# Patient Record
Sex: Female | Born: 1970 | Race: Asian | Hispanic: No | Marital: Married | Smoking: Never smoker
Health system: Southern US, Community
[De-identification: ages and names within clinical notes are randomized; demographics above are authoritative.]

## PROBLEM LIST (undated history)

## (undated) DIAGNOSIS — M1990 Unspecified osteoarthritis, unspecified site: Secondary | ICD-10-CM

## (undated) DIAGNOSIS — K219 Gastro-esophageal reflux disease without esophagitis: Secondary | ICD-10-CM

## (undated) DIAGNOSIS — I10 Essential (primary) hypertension: Secondary | ICD-10-CM

## (undated) DIAGNOSIS — Z87442 Personal history of urinary calculi: Secondary | ICD-10-CM

## (undated) DIAGNOSIS — F419 Anxiety disorder, unspecified: Secondary | ICD-10-CM

## (undated) DIAGNOSIS — F331 Major depressive disorder, recurrent, moderate: Secondary | ICD-10-CM

## (undated) HISTORY — DX: Unspecified osteoarthritis, unspecified site: M19.90

## (undated) HISTORY — PX: JOINT REPLACEMENT: SHX530

## (undated) HISTORY — PX: MINOR BREAST BIOPSY: SHX5977

## (undated) HISTORY — PX: BREAST EXCISIONAL BIOPSY: SUR124

## (undated) HISTORY — PX: BREAST SURGERY: SHX581

## (undated) HISTORY — DX: Anxiety disorder, unspecified: F41.9

## (undated) HISTORY — DX: Major depressive disorder, recurrent, moderate: F33.1

## (undated) HISTORY — PX: APPENDECTOMY: SHX54

## (undated) HISTORY — DX: Essential (primary) hypertension: I10

## (undated) HISTORY — DX: Gastro-esophageal reflux disease without esophagitis: K21.9

---

## 1993-06-08 HISTORY — PX: LITHOTRIPSY: SUR834

## 2005-10-01 ENCOUNTER — Ambulatory Visit: Payer: Self-pay | Admitting: Obstetrics and Gynecology

## 2012-04-20 ENCOUNTER — Ambulatory Visit: Payer: Self-pay | Admitting: Internal Medicine

## 2012-12-06 HISTORY — PX: TOTAL HIP ARTHROPLASTY: SHX124

## 2013-04-20 LAB — HM PAP SMEAR: HM Pap smear: NEGATIVE

## 2013-05-10 ENCOUNTER — Ambulatory Visit: Payer: Self-pay | Admitting: Internal Medicine

## 2014-03-13 ENCOUNTER — Ambulatory Visit: Payer: Self-pay | Admitting: Internal Medicine

## 2014-05-08 LAB — HM MAMMOGRAPHY: HM MAMMO: NORMAL

## 2014-05-22 ENCOUNTER — Ambulatory Visit: Payer: Self-pay | Admitting: Internal Medicine

## 2014-05-22 LAB — BASIC METABOLIC PANEL
BUN: 10 mg/dL (ref 4–21)
Creatinine: 0.5 mg/dL (ref <=1.1)

## 2014-05-22 LAB — LIPID PANEL
Cholesterol: 206 mg/dL — AB (ref 0–200)
HDL: 43 mg/dL (ref 35–70)
LDL Cholesterol: 135 mg/dL
Triglycerides: 143 mg/dL (ref 40–160)

## 2014-05-22 LAB — TSH: TSH: 1.7 u[IU]/mL (ref <=5.90)

## 2014-05-22 LAB — CBC AND DIFFERENTIAL: HEMOGLOBIN: 12.5 g/dL (ref 12.0–16.0)

## 2014-06-05 ENCOUNTER — Ambulatory Visit: Payer: Self-pay | Admitting: Internal Medicine

## 2015-01-18 ENCOUNTER — Ambulatory Visit (INDEPENDENT_AMBULATORY_CARE_PROVIDER_SITE_OTHER): Admitting: Family Medicine

## 2015-01-18 ENCOUNTER — Encounter: Payer: Self-pay | Admitting: Internal Medicine

## 2015-01-18 ENCOUNTER — Encounter: Payer: Self-pay | Admitting: Family Medicine

## 2015-01-18 ENCOUNTER — Other Ambulatory Visit: Payer: Self-pay

## 2015-01-18 VITALS — BP 94/72 | HR 76 | Ht 61.0 in | Wt 120.0 lb

## 2015-01-18 DIAGNOSIS — M5440 Lumbago with sciatica, unspecified side: Secondary | ICD-10-CM | POA: Insufficient documentation

## 2015-01-18 DIAGNOSIS — H8309 Labyrinthitis, unspecified ear: Secondary | ICD-10-CM | POA: Diagnosis not present

## 2015-01-18 DIAGNOSIS — M25561 Pain in right knee: Secondary | ICD-10-CM

## 2015-01-18 DIAGNOSIS — E785 Hyperlipidemia, unspecified: Secondary | ICD-10-CM | POA: Insufficient documentation

## 2015-01-18 DIAGNOSIS — N6019 Diffuse cystic mastopathy of unspecified breast: Secondary | ICD-10-CM | POA: Insufficient documentation

## 2015-01-18 DIAGNOSIS — M778 Other enthesopathies, not elsewhere classified: Secondary | ICD-10-CM | POA: Insufficient documentation

## 2015-01-18 DIAGNOSIS — M25559 Pain in unspecified hip: Secondary | ICD-10-CM | POA: Insufficient documentation

## 2015-01-18 DIAGNOSIS — IMO0002 Reserved for concepts with insufficient information to code with codable children: Secondary | ICD-10-CM | POA: Insufficient documentation

## 2015-01-18 DIAGNOSIS — K219 Gastro-esophageal reflux disease without esophagitis: Secondary | ICD-10-CM | POA: Insufficient documentation

## 2015-01-18 HISTORY — DX: Pain in right knee: M25.561

## 2015-01-18 MED ORDER — MECLIZINE HCL 25 MG PO TABS: 25.0000 mg | ORAL_TABLET | Freq: Four times a day (QID) | ORAL | Status: DC | PRN

## 2015-01-18 NOTE — Progress Notes (Signed)
Date:  01/18/2015   Name:  Renee Gomez   DOB:  [birth year]   MRN:  [id number]  PCP:  Halina Maidens, MD    Chief Complaint: Dizziness   History of Present Illness:  This is a 44 y.o. female who woke 4d ago with severe dizziness and nausea when she stood up. Sxs have improved, better during the day but worse getting out of bed. No fever, ear pain, rhinorrhea. Never had before.  Review of Systems:  Review of Systems  Patient Active Problem List   Diagnosis Date Noted  . Low back pain with sciatica 01/18/2015  . 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 Acid reflux 01/18/2015  . Arthralgia of hip 01/18/2015  . HLD (hyperlipidemia) 01/18/2015  . Gonalgia 01/18/2015  . Tendinitis of elbow or forearm 01/18/2015  . Labyrinthitis, acute 01/18/2015    Prior to Admission medications   Medication Sig Start Date End Date Taking? Authorizing Provider  ibuprofen (ADVIL) 200 MG tablet Take by mouth.   Yes Historical Provider, MD  Multiple Vitamins-Minerals (MULTIVITAMIN GUMMIES ADULT) CHEW Chew by mouth.   Yes Historical Provider, MD  meclizine (ANTIVERT) 25 MG tablet Take 1 tablet (25 mg total) by mouth every 6 (six) hours as needed for dizziness or nausea. 01/18/15   Adline Potter, MD    No Known Allergies  No past surgical history on file.  Social History  Substance Use Topics  . Smoking status: Never Smoker   . Smokeless tobacco: None  . Alcohol Use: No    Family History  Problem Relation Age of Onset  . Cancer Mother     ovarian    Medication list has been reviewed and updated.  Physical Examination: BP 94/72 mmHg  Pulse 76  Ht 5\' 1"  (1.549 m)  Wt 120 lb (54.432 kg)  BMI 22.69 kg/m2  Physical Exam  Constitutional: She appears well-developed and well-nourished. No distress.  HENT:  Head: Normocephalic and atraumatic.  Right Ear: External ear normal.  Left Ear: External ear normal.  Mouth/Throat: Oropharynx is clear and moist.  Hallpike strongly positive  Eyes:  Conjunctivae and EOM are normal. Pupils are equal, round, and reactive to light.  Neck: Neck supple.  Skin: She is not diaphoretic.    Assessment and Plan:  1. Labyrinthitis, acute, unspecified laterality Antivert prn, move slowly especially getting out of bed, expect resolution next 1-2 weeks   Return if symptoms worsen or fail to improve.  Satira Anis. [facility]  01/18/2015

## 2015-01-19 ENCOUNTER — Encounter: Payer: Self-pay | Admitting: Internal Medicine

## 2015-03-15 ENCOUNTER — Ambulatory Visit (INDEPENDENT_AMBULATORY_CARE_PROVIDER_SITE_OTHER)

## 2015-03-15 DIAGNOSIS — Z23 Encounter for immunization: Secondary | ICD-10-CM | POA: Diagnosis not present

## 2015-08-28 ENCOUNTER — Encounter: Payer: Self-pay | Admitting: Internal Medicine

## 2015-08-28 ENCOUNTER — Ambulatory Visit (INDEPENDENT_AMBULATORY_CARE_PROVIDER_SITE_OTHER): Admitting: Internal Medicine

## 2015-08-28 VITALS — BP 110/62 | HR 72 | Ht 61.0 in | Wt 120.8 lb

## 2015-08-28 DIAGNOSIS — Z0000 Encounter for general adult medical examination without abnormal findings: Secondary | ICD-10-CM

## 2015-08-28 DIAGNOSIS — Z124 Encounter for screening for malignant neoplasm of cervix: Secondary | ICD-10-CM

## 2015-08-28 DIAGNOSIS — R102 Pelvic and perineal pain: Secondary | ICD-10-CM

## 2015-08-28 DIAGNOSIS — N6019 Diffuse cystic mastopathy of unspecified breast: Secondary | ICD-10-CM

## 2015-08-28 DIAGNOSIS — M25552 Pain in left hip: Secondary | ICD-10-CM | POA: Diagnosis not present

## 2015-08-28 DIAGNOSIS — Z23 Encounter for immunization: Secondary | ICD-10-CM | POA: Diagnosis not present

## 2015-08-28 DIAGNOSIS — M25551 Pain in right hip: Secondary | ICD-10-CM

## 2015-08-28 DIAGNOSIS — K219 Gastro-esophageal reflux disease without esophagitis: Secondary | ICD-10-CM

## 2015-08-28 MED ORDER — NAPROXEN 500 MG PO TABS: 500.0000 mg | ORAL_TABLET | Freq: Two times a day (BID) | ORAL | Status: DC

## 2015-08-28 NOTE — Progress Notes (Signed)
Date:  08/28/2015   Name:  Renee Gomez   DOB:  02-May-1971   MRN:  [id number]   Chief Complaint: Annual Exam Renee Gomez is a 45 y.o. female who presents today for her Complete Annual Exam. She feels fairly well. She reports exercising none due to hip pain. She reports she is sleeping fairly well.   Fibrocystic breast - she had an abnormal mammogram last year - calcifications in the left breast. She was best to have a six-month follow-up did not complete that. She compulsive general breast discomfort but has not noticed a discrete mass.  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 Her left knee is replaced as well but she is delaying that as long as possible. She also has general stiffness and discomfort in her fingers her knees and her ankles. She is not taking any anti-inflammatory medication.  GERD - on no medication.  No recurrent symptoms.  No heartburn or trouble swallowing.  Review of Systems  Constitutional: Negative for fever, chills and fatigue.  HENT: Negative for congestion, hearing loss, tinnitus, trouble swallowing and voice change.   Eyes: Negative for visual disturbance.  Respiratory: Negative for cough, chest tightness, shortness of breath and wheezing.   Cardiovascular: Negative for chest pain, palpitations and leg swelling.  Gastrointestinal: Negative for vomiting, abdominal pain, diarrhea and constipation.  Endocrine: Negative for polydipsia and polyuria.  Genitourinary: Negative for dysuria, frequency, vaginal bleeding, vaginal discharge, genital sores, menstrual problem and pelvic pain.  Musculoskeletal: Positive for myalgias, joint swelling and arthralgias. Negative for gait problem.  Skin: Negative for color change and rash.  Neurological: Negative for dizziness, tremors, light-headedness and headaches.  Hematological: Negative for adenopathy. Does not bruise/bleed easily.  Psychiatric/Behavioral: Negative for sleep disturbance  and dysphoric mood. The patient is not nervous/anxious.     Patient Active Problem List   Diagnosis Date Noted  . Low back pain with sciatica 01/18/2015  . Fibrocystic breast 01/18/2015  . Acid reflux 01/18/2015  . Arthralgia of hip 01/18/2015  . Hyperlipidemia, mild 01/18/2015  . Knee pain, right 01/18/2015  . Tendinitis of elbow or forearm 01/18/2015    Prior to Admission medications   Not on File    No Known Allergies  Past Surgical History  Procedure Laterality Date  . 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 Appendectomy    . Minor breast biopsy Bilateral     neg x 2  . Lithotripsy  1995    Social History  Substance Use Topics  . Smoking status: Never Smoker   . Smokeless tobacco: None  . Alcohol Use: No     Medication list has been reviewed and updated.   Physical Exam  Constitutional: She is oriented to person, place, and time. She appears well-developed and well-nourished. No distress.  HENT:  Head: Normocephalic and atraumatic.  Right Ear: Tympanic membrane and ear canal normal.  Left Ear: Tympanic membrane and ear canal normal.  Nose: Right sinus exhibits no maxillary sinus tenderness. Left sinus exhibits no maxillary sinus tenderness.  Mouth/Throat: Uvula is midline and oropharynx is clear and moist.  Eyes: Conjunctivae and EOM are normal. Right eye exhibits no discharge. Left eye exhibits no discharge. No scleral icterus.  Neck: Normal range of motion. Carotid bruit is not present. No erythema present. No thyromegaly present.  Cardiovascular: Normal rate, regular rhythm, normal heart sounds and normal pulses.   Pulmonary/Chest: Effort normal. No respiratory distress. She has no wheezes. Right breast exhibits  tenderness. Right breast exhibits no mass, no nipple discharge and no skin change. Left breast exhibits tenderness. Left breast exhibits no mass, no nipple discharge and no skin change.  Abdominal: Soft. Bowel sounds are normal. There is no  hepatosplenomegaly. There is no tenderness. There is no CVA tenderness.  Genitourinary: Vagina normal and uterus normal. There is no tenderness, lesion or injury on the right labia. There is no tenderness, lesion or injury on the left labia. Cervix exhibits no motion tenderness, no discharge and no friability. Right adnexum displays no mass, no tenderness and no fullness. Left adnexum displays tenderness. Left adnexum displays no mass and no fullness.  Musculoskeletal:       Right hip: She exhibits decreased range of motion.       Left hip: She exhibits decreased range of motion.  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    She has no axillary adenopathy.       Right: No inguinal adenopathy present.       Left: No inguinal adenopathy present.  Neurological: She is alert and oriented to person, place, and time. She has normal reflexes. No cranial nerve deficit or sensory deficit.  Skin: Skin is warm, dry and intact. No rash noted.  Psychiatric: She has a normal mood and affect. Her speech is normal and behavior is normal. Thought content normal.  Nursing note and vitals reviewed.   BP 110/62 mmHg  Pulse 72  Ht 5\' 1"  (1.549 m)  Wt 120 lb 12.8 oz (54.795 kg)  BMI 22.84 kg/m2  LMP 08/13/2015 (Exact Date)  Assessment and Plan: 1. Annual physical exam - CBC with Differential/Platelet - Comprehensive metabolic panel - Lipid panel  2. Pelvic pain in female - US Pelvis Complete; Future - US Transvaginal Non-OB; Future  3. Pain of both hip joints Begin naproxen Follow up with Ortho  - naproxen (NAPROSYN) 500 MG tablet; Take 1 tablet (500 mg total) by mouth 2 (two) times daily with a meal.  Dispense: 60 tablet; Refill: 12 - TSH - Sedimentation rate  4. Fibrocystic breast, unspecified laterality Overdue for 6 mo follow up Will order bilateral Dx - MM Digital Diagnostic Bilat; Future - US BREAST LTD UNI LEFT INC AXILLA;  Future - US BREAST LTD UNI RIGHT INC AXILLA; Future  5. Gastroesophageal reflux disease without esophagitis Minimal symptoms - patient advised to take NSAID with food - CBC with Differential/Platelet  6. Need for diphtheria-tetanus-pertussis (Tdap) vaccine - Tdap vaccine greater than or equal to 7yo IM  7. Cervical cancer screening - Pap IG (Image Guided)   Halina Maidens, MD [facility]  08/28/2015

## 2015-08-28 NOTE — Patient Instructions (Signed)
Tdap Vaccine (Tetanus, Diphtheria and Pertussis): What You Need to Know 1. Why get vaccinated? Tetanus, diphtheria and pertussis are very serious diseases. Tdap vaccine can protect us from these diseases. And, Tdap vaccine given to pregnant women can protect newborn babies against pertussis. TETANUS (Lockjaw) is rare in the [place] today. It causes painful muscle tightening and stiffness, usually all over the body.  It can lead to tightening of muscles in the head and neck so you can't open your mouth, swallow, or sometimes even breathe. Tetanus kills about 1 out of 10 people who are infected even after receiving the best medical care. DIPHTHERIA is also rare in the [place] today. It can cause a thick coating to form in the back of the throat.  It can lead to breathing problems, heart failure, paralysis, and death. PERTUSSIS (Whooping Cough) causes severe coughing spells, which can cause difficulty breathing, vomiting and disturbed sleep.  It can also lead to weight loss, incontinence, and rib fractures. Up to 2 in 100 adolescents and 5 in 100 adults with pertussis are hospitalized or have complications, which could include pneumonia or death. These diseases are caused by bacteria. Diphtheria and pertussis are spread from person to person through secretions from coughing or sneezing. Tetanus enters the body through cuts, scratches, or wounds. Before vaccines, as many as 200,000 cases of diphtheria, 200,000 cases of pertussis, and hundreds of cases of tetanus, were reported in the [place] each year. Since vaccination began, reports of cases for tetanus and diphtheria have dropped by about 99% and for pertussis by about 80%. 2. Tdap vaccine Tdap vaccine can protect adolescents and adults from tetanus, diphtheria, and pertussis. One dose of Tdap is routinely given at age 11 or 12. People who did not get Tdap at that age should get it as soon as possible. Tdap is especially important  for healthcare professionals and anyone having close contact with a baby younger than 12 months. Pregnant women should get a dose of Tdap during every pregnancy, to protect the newborn from pertussis. Infants are most at risk for severe, life-threatening complications from pertussis. Another vaccine, called Td, protects against tetanus and diphtheria, but not pertussis. A Td booster should be given every 10 years. Tdap may be given as one of these boosters if you have never gotten Tdap before. Tdap may also be given after a severe cut or burn to prevent tetanus infection. Your doctor or the person giving you the vaccine can give you more information. Tdap may safely be given at the same time as other vaccines. 3. Some people should not get this vaccine  A person who has ever had a life-threatening allergic reaction after a previous dose of any diphtheria, tetanus or pertussis containing vaccine, OR has a severe allergy to any part of this vaccine, should not get Tdap vaccine. Tell the person giving the vaccine about any severe allergies.  Anyone who had coma or long repeated seizures within 7 days after a childhood dose of DTP or DTaP, or a previous dose of Tdap, should not get Tdap, unless a cause other than the vaccine was found. They can still get Td.  Talk to your doctor if you:  have seizures or another nervous system problem,  had severe pain or swelling after any vaccine containing diphtheria, tetanus or pertussis,  ever had a condition called Guillain-Barr Syndrome (GBS),  aren't feeling well on the day the shot is scheduled. 4. Risks With any medicine, including vaccines, there is   a chance of side effects. These are usually mild and go away on their own. Serious reactions are also possible but are rare. Most people who get Tdap vaccine do not have any problems with it. Mild problems following Tdap (Did not interfere with activities)  Pain where the shot was given (about 3 in 4  adolescents or 2 in 3 adults)  Redness or swelling where the shot was given (about 1 person in 5)  Mild fever of at least 100.4F (up to about 1 in 25 adolescents or 1 in 100 adults)  Headache (about 3 or 4 people in 10)  Tiredness (about 1 person in 3 or 4)  Nausea, vomiting, diarrhea, stomach ache (up to 1 in 4 adolescents or 1 in 10 adults)  Chills, sore joints (about 1 person in 10)  Body aches (about 1 person in 3 or 4)  Rash, swollen glands (uncommon) Moderate problems following Tdap (Interfered with activities, but did not require medical attention)  Pain where the shot was given (up to 1 in 5 or 6)  Redness or swelling where the shot was given (up to about 1 in 16 adolescents or 1 in 12 adults)  Fever over 102F (about 1 in 100 adolescents or 1 in 250 adults)  Headache (about 1 in 7 adolescents or 1 in 10 adults)  Nausea, vomiting, diarrhea, stomach ache (up to 1 or 3 people in 100)  Swelling of the entire arm where the shot was given (up to about 1 in 500). Severe problems following Tdap (Unable to perform usual activities; required medical attention)  Swelling, severe pain, bleeding and redness in the arm where the shot was given (rare). Problems that could happen after any vaccine:  People sometimes faint after a medical procedure, including vaccination. Sitting or lying down for about 15 minutes can help prevent fainting, and injuries caused by a fall. Tell your doctor if you feel dizzy, or have vision changes or ringing in the ears.  Some people get severe pain in the shoulder and have difficulty moving the arm where a shot was given. This happens very rarely.  Any medication can cause a severe allergic reaction. Such reactions from a vaccine are very rare, estimated at fewer than 1 in a million doses, and would happen within a few minutes to a few hours after the vaccination. As with any medicine, there is a very remote chance of a vaccine causing a serious  injury or death. The safety of vaccines is always being monitored. For more information, visit: www.cdc.gov/vaccinesafety/ 5. What if there is a serious problem? What should I look for?  Look for anything that concerns you, such as signs of a severe allergic reaction, very high fever, or unusual behavior.  Signs of a severe allergic reaction can include hives, swelling of the face and throat, difficulty breathing, a fast heartbeat, dizziness, and weakness. These would usually start a few minutes to a few hours after the vaccination. What should I do?  If you think it is a severe allergic reaction or other emergency that can't wait, call 9-1-1 or get the person to the nearest hospital. Otherwise, call your doctor.  Afterward, the reaction should be reported to the Vaccine Adverse Event Reporting System (VAERS). Your doctor might file this report, or you can do it yourself through the VAERS web site at www.vaers.hhs.gov, or by calling 1-800-822-7967. VAERS does not give medical advice.  6. The National Vaccine Injury Compensation Program The National Vaccine Injury Compensation Program (  VICP) is a federal program that was created to compensate people who may have been injured by certain vaccines. Persons who believe they may have been injured by a vaccine can learn about the program and about filing a claim by calling 1-800-338-2382 or visiting the VICP website at www.hrsa.gov/vaccinecompensation. There is a time limit to file a claim for compensation. 7. How can I learn more?  Ask your doctor. He or she can give you the vaccine package insert or suggest other sources of information.  Call your local or state health department.  Contact the Centers for Disease Control and Prevention (CDC):  Call 1-800-232-4636 (1-800-CDC-INFO) or  Visit CDC's website at www.cdc.gov/vaccines CDC Tdap Vaccine VIS (08/01/13)   This information is not intended to replace advice given to you by your health care  provider. Make sure you discuss any questions you have with your health care provider.   Document Released: 11/24/2011 Document Revised: 06/15/2014 Document Reviewed: 09/06/2013 Elsevier Interactive Patient Education 2016 Elsevier Inc.  

## 2015-08-29 ENCOUNTER — Telehealth: Payer: Self-pay

## 2015-08-29 LAB — COMPREHENSIVE METABOLIC PANEL
A/G RATIO: 1.3 (ref 1.2–2.2)
ALBUMIN: 4.2 g/dL (ref 3.5–5.5)
ALK PHOS: 54 IU/L (ref 39–117)
ALT: 13 IU/L (ref 0–32)
AST: 16 IU/L (ref 0–40)
BILIRUBIN TOTAL: 0.3 mg/dL (ref 0.0–1.2)
BUN/Creatinine Ratio: 20 (ref 9–23)
BUN: 10 mg/dL (ref 6–24)
CO2: 25 mmol/L (ref 18–29)
Calcium: 9 mg/dL (ref 8.7–10.2)
Chloride: 99 mmol/L (ref 96–106)
Creatinine, Ser: 0.51 mg/dL — ABNORMAL LOW (ref 0.57–1.00)
GFR, EST AFRICAN AMERICAN: 133 mL/min/{1.73_m2} (ref >59)
GFR, EST NON AFRICAN AMERICAN: 116 mL/min/{1.73_m2} (ref >59)
Globulin, Total: 3.2 g/dL (ref 1.5–4.5)
Glucose: 82 mg/dL (ref 65–99)
Potassium: 4.3 mmol/L (ref 3.5–5.2)
Sodium: 140 mmol/L (ref 134–144)
TOTAL PROTEIN: 7.4 g/dL (ref 6.0–8.5)

## 2015-08-29 LAB — CBC WITH DIFFERENTIAL/PLATELET
Basophils Absolute: 0.1 x10E3/uL (ref 0.0–0.2)
Basos: 1 %
EOS (ABSOLUTE): 0.1 x10E3/uL (ref 0.0–0.4)
Eos: 2 %
Hematocrit: 36.8 % (ref 34.0–46.6)
Hemoglobin: 12 g/dL (ref 11.1–15.9)
Immature Grans (Abs): 0 x10E3/uL (ref 0.0–0.1)
Immature Granulocytes: 0 %
Lymphocytes Absolute: 2.8 x10E3/uL (ref 0.7–3.1)
Lymphs: 36 %
MCH: 28.9 pg (ref 26.6–33.0)
MCHC: 32.6 g/dL (ref 31.5–35.7)
MCV: 89 fL (ref 79–97)
Monocytes Absolute: 0.5 x10E3/uL (ref 0.1–0.9)
Monocytes: 6 %
Neutrophils Absolute: 4.2 x10E3/uL (ref 1.4–7.0)
Neutrophils: 55 %
Platelets: 342 x10E3/uL (ref 150–379)
RBC: 4.15 x10E6/uL (ref 3.77–5.28)
RDW: 14.1 % (ref 12.3–15.4)
WBC: 7.7 x10E3/uL (ref 3.4–10.8)

## 2015-08-29 LAB — LIPID PANEL
Chol/HDL Ratio: 4.6 ratio — ABNORMAL HIGH (ref 0.0–4.4)
Cholesterol, Total: 231 mg/dL — ABNORMAL HIGH (ref 100–199)
HDL: 50 mg/dL
LDL Calculated: 155 mg/dL — ABNORMAL HIGH (ref 0–99)
Triglycerides: 132 mg/dL (ref 0–149)
VLDL Cholesterol Cal: 26 mg/dL (ref 5–40)

## 2015-08-29 LAB — TSH: TSH: 2.59 u[IU]/mL (ref 0.450–4.500)

## 2015-08-29 LAB — SEDIMENTATION RATE: Sed Rate: 17 mm/h (ref 0–32)

## 2015-08-29 NOTE — Telephone Encounter (Signed)
-----   Message from Glean Hess, MD sent at 08/29/2015  2:13 PM EDT ----- Labs are normal.  Cholesterol is good with high HDL. Test of inflammation is normal so not likely to be RA or Lupus as cause of arthritis.

## 2015-08-29 NOTE — Telephone Encounter (Signed)
Spoke with patient. Patient advised of all results and verbalized understanding. Will call back with any future questions or concerns. MA[facility]  

## 2015-09-03 LAB — PAP IG (IMAGE GUIDED): PAP SMEAR COMMENT: 0

## 2015-09-04 ENCOUNTER — Telehealth: Payer: Self-pay

## 2015-09-04 NOTE — Telephone Encounter (Signed)
-----   Message from Glean Hess, MD sent at 09/03/2015 12:00 PM EDT ----- Pap is normal.

## 2015-09-04 NOTE — Telephone Encounter (Signed)
Spoke with patient. Patient advised of all results and verbalized understanding. Will call back with any future questions or concerns. MA[facility]  

## 2015-09-13 ENCOUNTER — Ambulatory Visit: Payer: Self-pay

## 2015-09-13 ENCOUNTER — Other Ambulatory Visit: Payer: Self-pay

## 2015-09-23 ENCOUNTER — Ambulatory Visit
Admission: RE | Admit: 2015-09-23 | Discharge: 2015-09-23 | Disposition: A | Discharge status: Home / Self Care | Source: Ambulatory Visit | Attending: Internal Medicine | Admitting: Internal Medicine

## 2015-09-23 DIAGNOSIS — R102 Pelvic and perineal pain: Secondary | ICD-10-CM | POA: Diagnosis present

## 2015-09-24 ENCOUNTER — Telehealth: Payer: Self-pay

## 2015-09-24 NOTE — Telephone Encounter (Signed)
-----   Message from Glean Hess, MD sent at 09/23/2015 12:14 PM EDT ----- US showed prominent veins suggesting this as the cause of discomfort.  Naproxen is the treatment - take twice a day.  If symptoms are not improving, would refer to GYN.

## 2015-09-24 NOTE — Telephone Encounter (Signed)
Spoke with patient. Patient advised of all results and verbalized understanding. Will call back with any future questions or concerns. MA[facility]  

## 2015-09-27 ENCOUNTER — Ambulatory Visit
Admission: RE | Admit: 2015-09-27 | Discharge: 2015-09-27 | Disposition: A | Discharge status: Home / Self Care | Source: Ambulatory Visit | Attending: Internal Medicine | Admitting: Internal Medicine

## 2015-09-27 ENCOUNTER — Other Ambulatory Visit: Payer: Self-pay | Admitting: Internal Medicine

## 2015-09-27 DIAGNOSIS — N6019 Diffuse cystic mastopathy of unspecified breast: Secondary | ICD-10-CM | POA: Diagnosis present

## 2015-09-27 DIAGNOSIS — R921 Mammographic calcification found on diagnostic imaging of breast: Secondary | ICD-10-CM | POA: Diagnosis not present

## 2015-11-01 ENCOUNTER — Telehealth: Payer: Self-pay

## 2015-11-01 ENCOUNTER — Other Ambulatory Visit: Payer: Self-pay | Admitting: Internal Medicine

## 2015-11-01 MED ORDER — AMOXICILLIN 500 MG PO CAPS: 2000.0000 mg | ORAL_CAPSULE | Freq: Once | ORAL | Status: DC

## 2015-11-01 NOTE — Telephone Encounter (Signed)
Husband called for prophylactic Amox for dental procedure Send [facility] [address]

## 2016-02-11 ENCOUNTER — Other Ambulatory Visit: Payer: Self-pay | Admitting: Internal Medicine

## 2016-02-11 ENCOUNTER — Telehealth: Payer: Self-pay

## 2016-02-11 MED ORDER — AMOXICILLIN 500 MG PO CAPS: 2000.0000 mg | ORAL_CAPSULE | Freq: Once | ORAL | 1 refills | Status: DC

## 2016-02-11 NOTE — Telephone Encounter (Signed)
Patient needs prophylactic Abx for dental work due to hip replacement.

## 2016-02-18 ENCOUNTER — Telehealth: Payer: Self-pay

## 2016-02-18 ENCOUNTER — Other Ambulatory Visit: Payer: Self-pay | Admitting: Internal Medicine

## 2016-02-18 MED ORDER — AMOXICILLIN 500 MG PO CAPS: 2000.0000 mg | ORAL_CAPSULE | Freq: Once | ORAL | 1 refills | Status: DC

## 2016-02-18 NOTE — Telephone Encounter (Signed)
Patient's husband called and stated she needs amoxicillin for her dental appt for next week - stated his wife received a script last week but it's a two part cleaning and needs another refill.  Pharmacy: [facility] near [facility] in [place]  If there are any questions please contact husband back and leave voicemail if unable to reach.

## 2016-02-18 NOTE — Telephone Encounter (Signed)
Please review this

## 2016-02-18 NOTE — Telephone Encounter (Signed)
I sent #4 capsules with 1 refill on 02/11/16 so there should be a refill available.

## 2016-05-12 ENCOUNTER — Emergency Department
Admission: EM | Admit: 2016-05-12 | Discharge: 2016-05-12 | Disposition: A | Discharge status: Home / Self Care | Attending: Emergency Medicine | Admitting: Emergency Medicine

## 2016-05-12 ENCOUNTER — Encounter: Payer: Self-pay | Admitting: Emergency Medicine

## 2016-05-12 ENCOUNTER — Emergency Department

## 2016-05-12 DIAGNOSIS — R1030 Lower abdominal pain, unspecified: Secondary | ICD-10-CM | POA: Diagnosis present

## 2016-05-12 DIAGNOSIS — R55 Syncope and collapse: Secondary | ICD-10-CM | POA: Insufficient documentation

## 2016-05-12 DIAGNOSIS — M87052 Idiopathic aseptic necrosis of left femur: Secondary | ICD-10-CM | POA: Diagnosis not present

## 2016-05-12 DIAGNOSIS — N3000 Acute cystitis without hematuria: Secondary | ICD-10-CM | POA: Diagnosis not present

## 2016-05-12 LAB — URINALYSIS, COMPLETE (UACMP) WITH MICROSCOPIC
Bilirubin Urine: NEGATIVE
Glucose, UA: NEGATIVE mg/dL
Hgb urine dipstick: NEGATIVE
Ketones, ur: NEGATIVE mg/dL
Nitrite: NEGATIVE
PH: 8.5 — AB (ref 5.0–8.0)
Protein, ur: NEGATIVE mg/dL
SPECIFIC GRAVITY, URINE: 1.015 (ref 1.005–1.030)

## 2016-05-12 LAB — BASIC METABOLIC PANEL
Anion gap: 7 (ref 5–15)
BUN: 11 mg/dL (ref 6–20)
CALCIUM: 8.8 mg/dL — AB (ref 8.9–10.3)
CHLORIDE: 106 mmol/L (ref 101–111)
CO2: 25 mmol/L (ref 22–32)
CREATININE: 0.52 mg/dL (ref 0.44–1.00)
GFR calc Af Amer: >60 mL/min (ref >60)
GFR calc non Af Amer: >60 mL/min (ref >60)
Glucose, Bld: 93 mg/dL (ref 65–99)
Potassium: 4 mmol/L (ref 3.5–5.1)
SODIUM: 138 mmol/L (ref 135–145)

## 2016-05-12 LAB — CBC
HCT: 34.5 % — ABNORMAL LOW (ref 35.0–47.0)
Hemoglobin: 11.3 g/dL — ABNORMAL LOW (ref 12.0–16.0)
MCH: 28 pg (ref 26.0–34.0)
MCHC: 32.9 g/dL (ref 32.0–36.0)
MCV: 85.3 fL (ref 80.0–100.0)
PLATELETS: 247 10*3/uL (ref 150–440)
RBC: 4.04 MIL/uL (ref 3.80–5.20)
RDW: 14.6 % — AB (ref 11.5–14.5)
WBC: 6.7 10*3/uL (ref 3.6–11.0)

## 2016-05-12 MED ORDER — DEXTROSE 5 % IV SOLN: 1.0000 g | Freq: Once | INTRAVENOUS | Status: DC

## 2016-05-12 MED ORDER — SODIUM CHLORIDE 0.9 % IV BOLUS (SEPSIS)
1000.0000 mL | Freq: Once | INTRAVENOUS | Status: AC
  Administered 2016-05-12: 1000 mL via INTRAVENOUS

## 2016-05-12 MED ORDER — CEFTRIAXONE SODIUM-DEXTROSE 1-3.74 GM-% IV SOLR
1.0000 g | Freq: Once | INTRAVENOUS | Status: AC
  Administered 2016-05-12: 1 g via INTRAVENOUS
  Filled 2016-05-12: qty 50

## 2016-05-12 MED ORDER — IOPAMIDOL (ISOVUE-300) INJECTION 61%
100.0000 mL | Freq: Once | INTRAVENOUS | Status: AC | PRN
  Administered 2016-05-12: 100 mL via INTRAVENOUS

## 2016-05-12 MED ORDER — CEPHALEXIN 500 MG PO CAPS: 500.0000 mg | ORAL_CAPSULE | Freq: Two times a day (BID) | ORAL | 0 refills | Status: DC

## 2016-05-12 MED ORDER — IOPAMIDOL (ISOVUE-300) INJECTION 61%
30.0000 mL | Freq: Once | INTRAVENOUS | Status: AC | PRN
  Administered 2016-05-12: 30 mL via ORAL

## 2016-05-12 NOTE — ED Triage Notes (Signed)
Patient presents to the ED with lower abdominal pain and dizziness x 2 days.  Patient reports a pre-syncopal episode this morning.  Patient appears pale and tired.  Patient is in no obvious distress at this time.  Patient reports nausea but denies vomiting and diarrhea.

## 2016-05-12 NOTE — ED Notes (Signed)
Patient transported to CT 

## 2016-05-12 NOTE — Discharge Instructions (Addendum)
You were evaluated for abdominal pain, and frequency of urination, and are being treated for urinary tract infection with antibiotic Keflex. Return to the emergency room for any worsening symptoms including worsening abdominal pain, dizziness or passing out, black or bloody stool, vomiting, concern for dehydration such as not making urine or dry mouth, chest pain, irregular heartbeat, numbness, weakness, or any other symptoms concerning to you.  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

## 2016-05-12 NOTE — ED Notes (Signed)
ED Provider at bedside. 

## 2016-05-12 NOTE — ED Notes (Addendum)
Pt in via triage with complaints of lower abdominal cramping since this morning, pt reports feeling nauseas, short of breath and flushed with a near syncope episode.  Pt denies any abnormal vaginal bleeding, discharge or urinary symptoms.  Pt A/Ox4, vitals WDL, no immediate distress noted at this time.

## 2016-05-12 NOTE — ED Provider Notes (Signed)
[facility] Emergency Department Provider Note ____________________________________________   I have reviewed the triage vital signs and the triage nursing note.  HISTORY  Chief Complaint Abdominal Pain and Near Syncope   Historian Patient  HPI Renee Gomez is a 45 y.o. female here for 24 hours of lower abdominal pain.  Started yesterday, points to pelvis and both sides, although left may have been worse.  History of appendicitis.  Denies fever. Reports frequency of urination and pain somewhat worse with urination. Nausea without vomiting. No diarrhea. She did have a near syncopal episode yesterday where she was going to the bathroom and felt a severe lower abdominal pain and buttock on the cold sweat and felt lightheaded and dizzy. She feels a little bit better from that now. No chest pain or palpitations. No coughing. No recent upper respiratory symptoms. Symptoms are moderate, but improved from what they were previously.    History reviewed. No pertinent past medical history.  Patient Active Problem List   Diagnosis Date Noted  . Low back pain with sciatica 01/18/2015  . Fibrocystic breast 01/18/2015  . Acid reflux 01/18/2015  . Arthralgia of hip 01/18/2015  . Hyperlipidemia, mild 01/18/2015  . Knee pain, right 01/18/2015  . Tendinitis of elbow or forearm 01/18/2015    Past Surgical History:  Procedure Laterality Date  . APPENDECTOMY    . BREAST EXCISIONAL BIOPSY Bilateral 25+yrs ago   neg  . LITHOTRIPSY  1995  . MINOR BREAST BIOPSY Bilateral    neg x 2  . TOTAL HIP ARTHROPLASTY Right 12/2012    Prior to Admission medications   Medication Sig Start Date End Date Taking? Authorizing Provider  cephALEXin (KEFLEX) 500 MG capsule Take 1 capsule (500 mg total) by mouth 2 (two) times daily. 05/12/16   Lisa Roca, MD  naproxen (NAPROSYN) 500 MG tablet Take 1 tablet (500 mg total) by mouth 2 (two) times daily with a meal. Patient not taking:  Reported on 05/12/2016 08/28/15   Glean Hess, MD    No Known Allergies  Family History  Problem Relation Age of Onset  . Cancer Mother     ovarian    Social History Social History  Substance Use Topics  . Smoking status: Never Smoker  . Smokeless tobacco: Never Used  . Alcohol use No    Review of Systems  Constitutional: Negative for fever. Eyes: Negative for visual changes. ENT: Negative for sore throat. Cardiovascular: Negative for chest pain. Respiratory: Negative for shortness of breath. Gastrointestinal: As per history of present illness. Genitourinary: Positive for frequency of urination yesterday.. Musculoskeletal: Negative for back pain. Skin: Negative for rash. Neurological: Negative for headache. 10 point Review of Systems otherwise negative ____________________________________________   PHYSICAL EXAM:  VITAL SIGNS: ED Triage Vitals  Enc Vitals Group     BP 05/12/16 0850 (!) 106/51     Pulse Rate 05/12/16 0850 66     Resp 05/12/16 0850 18     Temp 05/12/16 0850 97.6 F (36.4 C)     Temp Source 05/12/16 0850 Oral     SpO2 05/12/16 0850 100 %     Weight 05/12/16 0851 125 lb (56.7 kg)     Height 05/12/16 0851 5\' 1"  (1.549 m)     Head Circumference --      Peak Flow --      Pain Score 05/12/16 0851 5     Pain Loc --      Pain Edu? --  Excl. in [place]? --      Constitutional: Alert and oriented. Well appearing and in no distress. HEENT   Head: Normocephalic and atraumatic.      Eyes: Conjunctivae are normal. PERRL. Normal extraocular movements.      Ears:         Nose: No congestion/rhinnorhea.   Mouth/Throat: Mucous membranes are moist.   Neck: No stridor. Cardiovascular/Chest: Normal rate, regular rhythm.  No murmurs, rubs, or gallops. Respiratory: Normal respiratory effort without tachypnea nor retractions. Breath sounds are clear and equal bilaterally. No wheezes/rales/rhonchi. Gastrointestinal: Soft. No distention, no  guarding, no rebound. Moderate tenderness in the suprapubic and left lower quadrant.  Genitourinary/rectal: Deferred Musculoskeletal: Nontender with normal range of motion in all extremities. No joint effusions.  No lower extremity tenderness.  No edema. Neurologic:  Normal speech and language. No gross or focal neurologic deficits are appreciated. Skin:  Skin is warm, dry and intact. No rash noted. Psychiatric: Mood and affect are normal. Speech and behavior are normal. Patient exhibits appropriate insight and judgment.   ____________________________________________  LABS (pertinent positives/negatives)  Labs Reviewed  BASIC METABOLIC PANEL - Abnormal; Notable for the following:       Result Value   Calcium 8.8 (*)    All other components within normal limits  CBC - Abnormal; Notable for the following:    Hemoglobin 11.3 (*)    HCT 34.5 (*)    RDW 14.6 (*)    All other components within normal limits  URINALYSIS, COMPLETE (UACMP) WITH MICROSCOPIC - Abnormal; Notable for the following:    APPearance CLOUDY (*)    pH 8.5 (*)    Leukocytes, UA SMALL (*)    Squamous Epithelial / LPF 6-30 (*)    Bacteria, UA FEW (*)    All other components within normal limits  URINE CULTURE  CULTURE, BLOOD (ROUTINE X 2)  CULTURE, BLOOD (ROUTINE X 2)    ____________________________________________    EKG I, Lisa Roca, MD, the attending physician have personally viewed and interpreted all ECGs.  62 bpm. Normal sinus rhythm. Normal axis. Narrow QRS. Nonspecific ST and T-wave ____________________________________________  RADIOLOGY All Xrays were viewed by me. Imaging interpreted by Radiologist.  CT abdomen and pelvis with contrast:  IMPRESSION: 1. No acute abdominal or pelvic pathology. 2. Avascular necrosis of the left hip.  __________________________________________  PROCEDURES  Procedure(s) performed: None  Critical Care performed:  None  ____________________________________________   ED COURSE / ASSESSMENT AND PLAN  Pertinent labs & imaging results that were available during my care of the patient were reviewed by me and considered in my medical decision making (see chart for details).   Renee Gomez is here with lower abdominal pain. Her urinalysis may be consistent with urinary tract infection especially given her symptoms of frequency of urination yesterday. However she is more tender especially in the left lower quadrant, and I discussed risks and benefit for obtaining CT scan for further evaluation and we chose to proceed.  No reported fevers, and afebrile here. No elevated pulse rate. Her blood pressures are around 123456 systolic with one blood pressure document and 93/44. She is given some IV fluid bolus here in the emergency department.  After urine culture, patient will be given Rocephin. Although I don't think that she is showing any signs of sepsis now, given the issue with the near syncopal feeling yesterday, this sounds like was probably vagal associated pain, but I will go ahead and send blood cultures as well.  No  neurologic symptoms, I'm less suspicious for acute intravascular emergency.  CT skin abdomen reassuring. Patient feels well and she will be discharged home with treatment for UTI.  She knows of the chronic condition of the left hip, and is asked to follow-up with her orthopedic doctor.    CONSULTATIONS:  None  Patient / Family / Caregiver informed of clinical course, medical decision-making process, and agree with plan.   I discussed return precautions, follow-up instructions, and discharge instructions with patient and/or family.   ___________________________________________   FINAL CLINICAL IMPRESSION(S) / ED DIAGNOSES   Final diagnoses:  Acute cystitis without hematuria  Near syncope  Avascular necrosis of bone of left hip [facility])              Note: This dictation  was prepared with Dragon dictation. Any transcriptional errors that result from this process are unintentional    Lisa Roca, MD 05/12/16 1536

## 2016-05-13 LAB — URINE CULTURE

## 2016-05-17 LAB — CULTURE, BLOOD (ROUTINE X 2)
Culture: NO GROWTH
Culture: NO GROWTH

## 2016-07-22 ENCOUNTER — Ambulatory Visit (INDEPENDENT_AMBULATORY_CARE_PROVIDER_SITE_OTHER): Admitting: Internal Medicine

## 2016-07-22 ENCOUNTER — Encounter: Payer: Self-pay | Admitting: Internal Medicine

## 2016-07-22 VITALS — BP 128/80 | HR 92 | Temp 98.5°F | Ht 61.0 in | Wt 125.0 lb

## 2016-07-22 DIAGNOSIS — R69 Illness, unspecified: Secondary | ICD-10-CM

## 2016-07-22 DIAGNOSIS — J0100 Acute maxillary sinusitis, unspecified: Secondary | ICD-10-CM

## 2016-07-22 DIAGNOSIS — J111 Influenza due to unidentified influenza virus with other respiratory manifestations: Secondary | ICD-10-CM

## 2016-07-22 MED ORDER — OSELTAMIVIR PHOSPHATE 75 MG PO CAPS: 75.0000 mg | ORAL_CAPSULE | Freq: Two times a day (BID) | ORAL | 0 refills | Status: DC

## 2016-07-22 MED ORDER — AZITHROMYCIN 250 MG PO TABS: ORAL_TABLET | ORAL | 0 refills | Status: DC

## 2016-07-22 NOTE — Progress Notes (Signed)
Date:  07/22/2016   Name:  Renee Gomez   DOB:  Oct 12, 1970   MRN:  [id number]   Chief Complaint: URI and Headache (Pt stated headache, body ache for 2 days) URI   This is a new problem. The current episode started yesterday. The problem has been gradually worsening. Maximum temperature: body aches and sweats - has not taken temperature. Associated symptoms include congestion, coughing, headaches, sinus pain and a sore throat. Pertinent negatives include no abdominal pain, chest pain, diarrhea, rash, vomiting or wheezing. She has tried acetaminophen for the symptoms. The treatment provided mild relief.  Husband was seen this AM with influenza sx.    Review of Systems  Constitutional: Positive for chills, diaphoresis and fatigue.  HENT: Positive for congestion, sinus pain and sore throat.   Respiratory: Positive for cough. Negative for chest tightness and wheezing.   Cardiovascular: Negative for chest pain.  Gastrointestinal: Negative for abdominal pain, diarrhea and vomiting.  Musculoskeletal: Positive for myalgias.  Skin: Negative for rash.  Neurological: Positive for headaches. Negative for dizziness and numbness.    Patient Active Problem List   Diagnosis Date Noted  . Low back pain with sciatica 01/18/2015  . Fibrocystic breast 01/18/2015  . Acid reflux 01/18/2015  . Arthralgia of hip 01/18/2015  . Hyperlipidemia, mild 01/18/2015  . Knee pain, right 01/18/2015  . Tendinitis of elbow or forearm 01/18/2015    Prior to Admission medications   Medication Sig Start Date End Date Taking? Authorizing Provider  naproxen (NAPROSYN) 500 MG tablet Take 1 tablet (500 mg total) by mouth 2 (two) times daily with a meal. Patient not taking: Reported on 05/12/2016 08/28/15   Glean Hess, MD    No Known Allergies  Past Surgical History:  Procedure Laterality Date  . APPENDECTOMY    . BREAST EXCISIONAL BIOPSY Bilateral 25+yrs ago   neg  . LITHOTRIPSY  1995  . MINOR BREAST  BIOPSY Bilateral    neg x 2  . TOTAL HIP ARTHROPLASTY Right 12/2012    Social History  Substance Use Topics  . Smoking status: Never Smoker  . Smokeless tobacco: Never Used  . Alcohol use No     Medication list has been reviewed and updated.   Physical Exam  Constitutional: She is oriented to person, place, and time. She appears well-developed. She has a sickly appearance. No distress.  HENT:  Head: Normocephalic and atraumatic.  Right Ear: Tympanic membrane and ear canal normal.  Left Ear: Tympanic membrane and ear canal normal.  Nose: Right sinus exhibits maxillary sinus tenderness. Left sinus exhibits maxillary sinus tenderness.  Mouth/Throat: No posterior oropharyngeal erythema.  Cardiovascular: Normal rate, regular rhythm and normal heart sounds.   Pulmonary/Chest: Effort normal and breath sounds normal. No respiratory distress.  Musculoskeletal: Normal range of motion.  Lymphadenopathy:    She has no cervical adenopathy.  Neurological: She is alert and oriented to person, place, and time.  Skin: Skin is warm and dry. No rash noted.  Psychiatric: She has a normal mood and affect. Her behavior is normal. Thought content normal.  Nursing note and vitals reviewed.   BP 128/80   Pulse 92   Temp 98.5 F (36.9 C)   Ht 5\' 1"  (1.549 m)   Wt 125 lb (56.7 kg)   SpO2 98%   BMI 23.62 kg/m   Assessment and Plan: 1. Influenza-like illness Continue Advil every 6 hours, fluids and rest - oseltamivir (TAMIFLU) 75 MG capsule; Take 1 capsule (75 mg  total) by mouth 2 (two) times daily.  Dispense: 10 capsule; Refill: 0  2. Acute non-recurrent maxillary sinusitis - azithromycin (ZITHROMAX Z-PAK) 250 MG tablet; UAD  Dispense: 6 each; Refill: 0   Halina Maidens, MD [facility]  07/22/2016

## 2016-07-22 NOTE — Patient Instructions (Signed)

## 2016-08-15 IMAGING — MG MM DIGITAL DIAGNOSTIC BILAT W/ TOMO W/ CAD
9 of 16 series · 9 of 32 positions shown · non-contrast
Comparison: Previous exam(s).

CLINICAL DATA: Patient was recommended for a six-month follow-up of
probably benign calcifications in the left breast after a diagnostic
mammogram dated 06/05/2014. Patient returns today for that follow-up
exam.

EXAM:
2D DIGITAL DIAGNOSTIC BILATERAL MAMMOGRAM WITH CAD AND ADJUNCT TOMO

[R CC (1 of 2)]
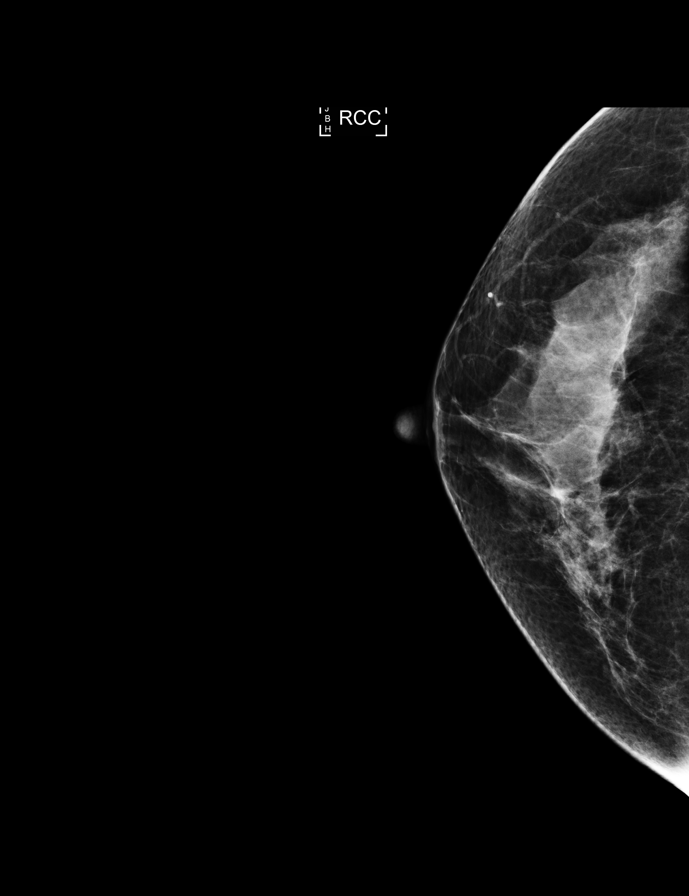

[L CC]
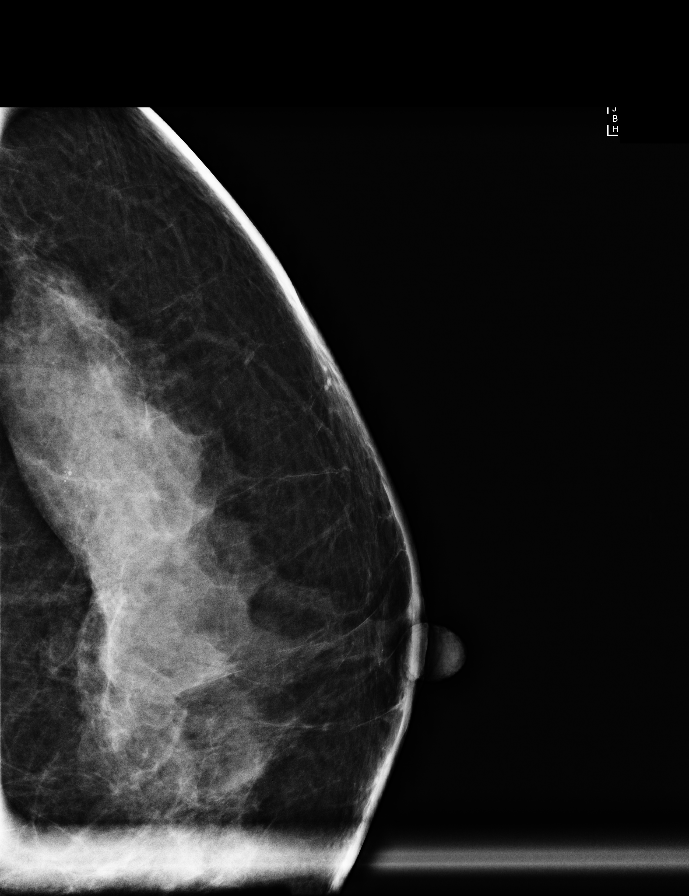

[L ML]
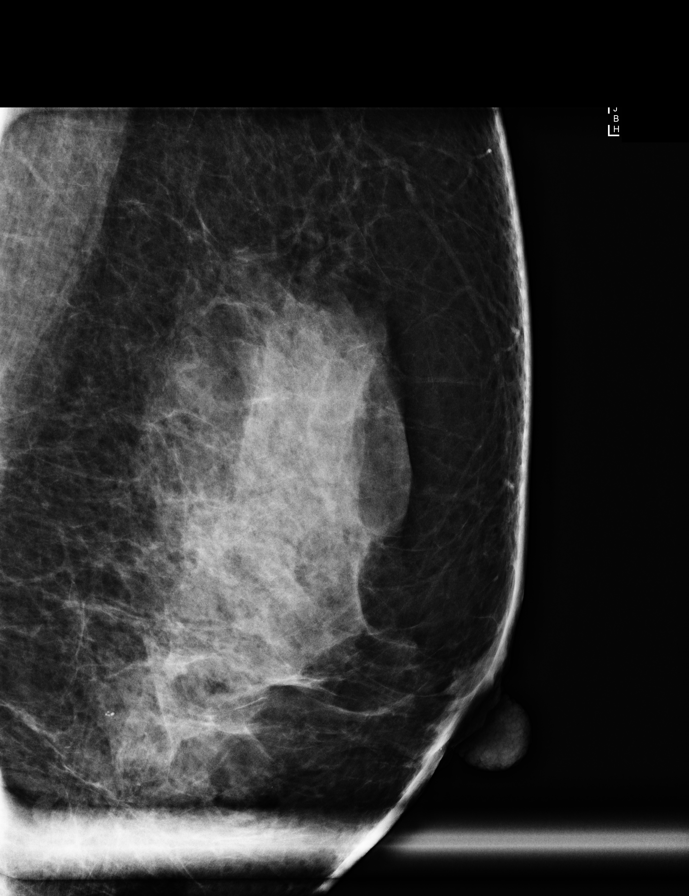

[R MLO (1 of 2)]
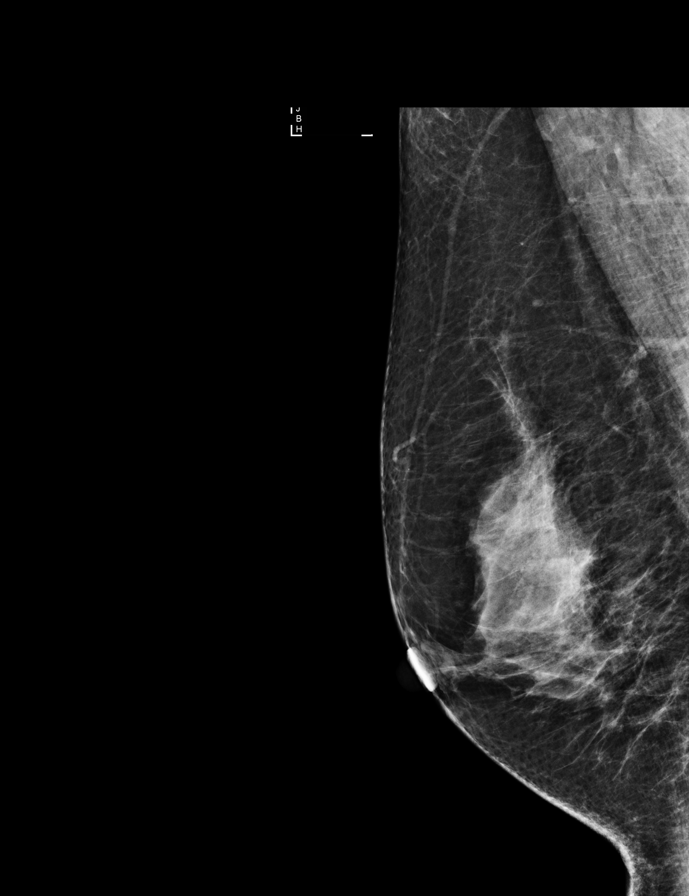

[R CC synth-2D]
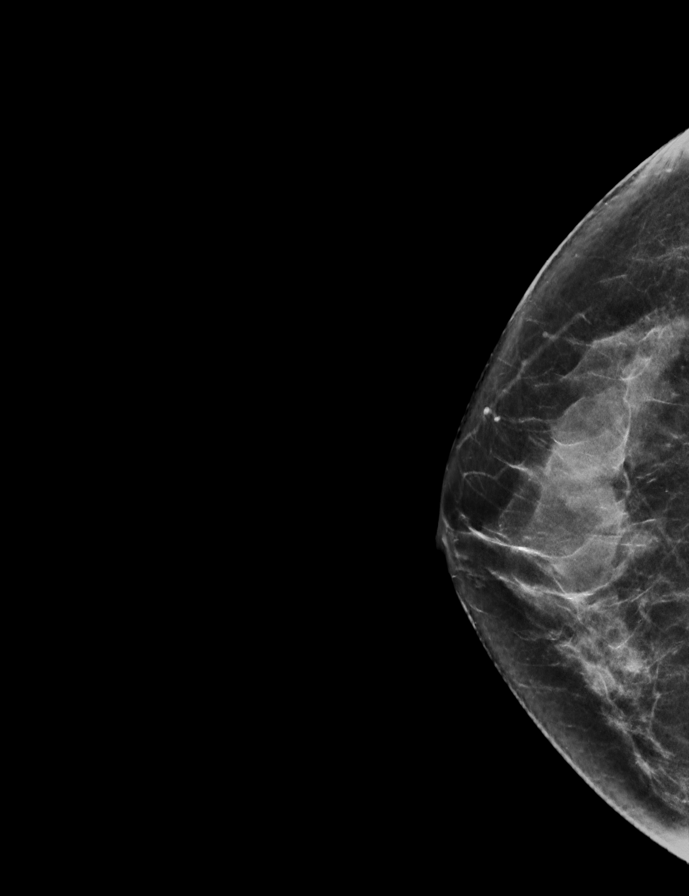

[R MLO (2 of 2)]
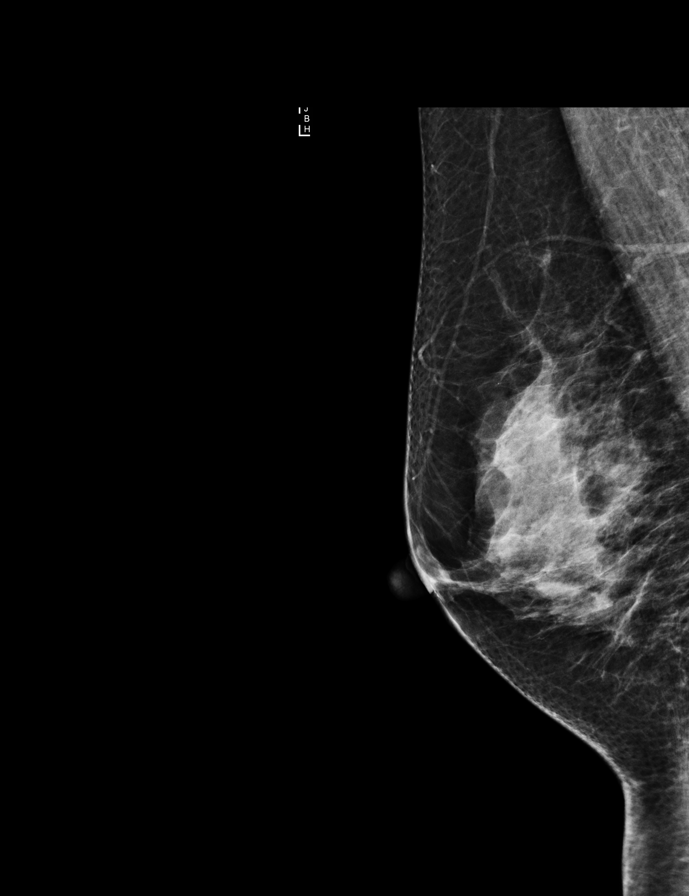

[L MLO]
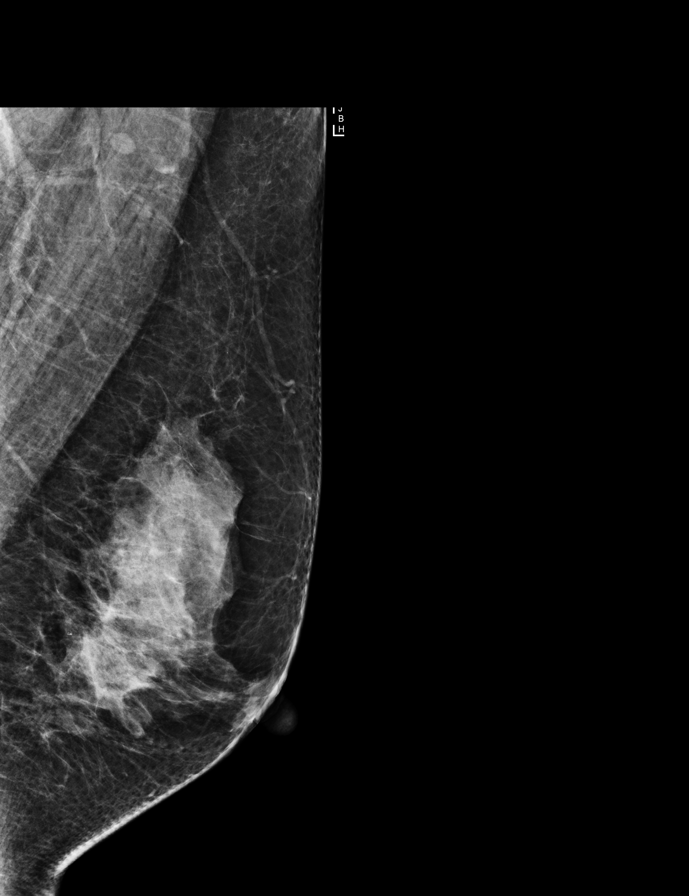

[L CC synth-2D]
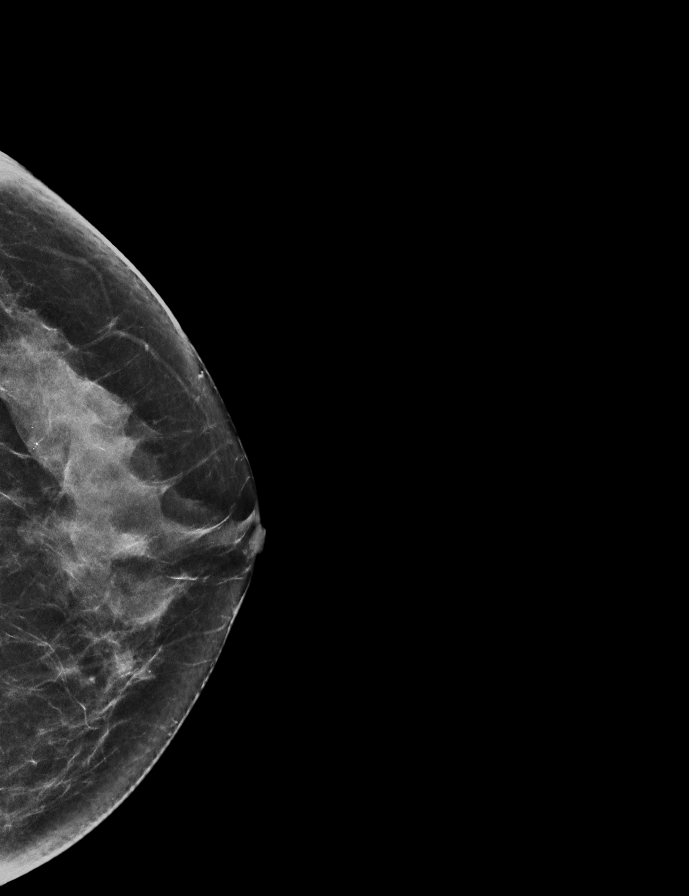

[R CC (2 of 2)]
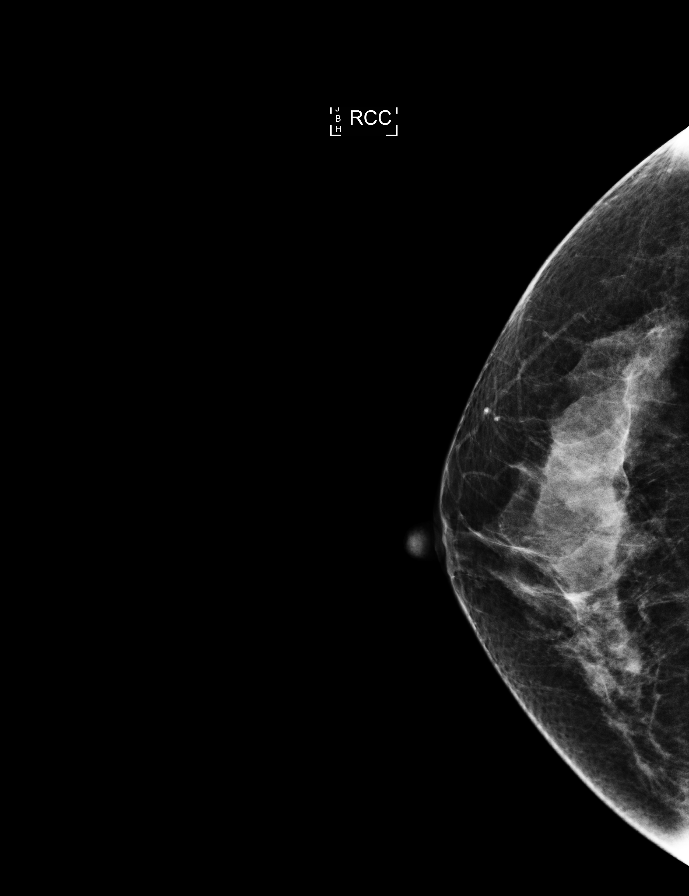

[9 of 32 positions shown; findings below may reference images not displayed]

ACR Breast Density Category c: The breast tissue is heterogeneously
dense, which may obscure small masses.
FINDINGS: There are stable benign calcifications within the left breast. There
are no new dominant masses, suspicious calcifications or secondary
signs of malignancy within either breast.

Mammographic images were processed with CAD.
IMPRESSION: No evidence of malignancy within either breast. Stable benign
calcifications within the left breast.

Patient may return to routine annual bilateral screening mammogram
schedule.

RECOMMENDATION:
Screening mammogram in one year.(Code:[id number]-K-57M)

I have discussed the findings and recommendations with the patient.
Results were also provided in writing at the conclusion of the
visit. If applicable, a reminder letter will be sent to the patient
regarding the next appointment.

BI-RADS CATEGORY  2: Benign.

## 2016-09-21 ENCOUNTER — Encounter: Payer: Self-pay | Admitting: Internal Medicine

## 2016-09-21 ENCOUNTER — Ambulatory Visit (INDEPENDENT_AMBULATORY_CARE_PROVIDER_SITE_OTHER): Admitting: Internal Medicine

## 2016-09-21 VITALS — BP 118/78 | HR 70 | Temp 97.6°F | Ht 61.0 in | Wt 124.0 lb

## 2016-09-21 DIAGNOSIS — M25551 Pain in right hip: Secondary | ICD-10-CM | POA: Diagnosis not present

## 2016-09-21 DIAGNOSIS — R300 Dysuria: Secondary | ICD-10-CM

## 2016-09-21 DIAGNOSIS — M25552 Pain in left hip: Secondary | ICD-10-CM

## 2016-09-21 LAB — POC URINALYSIS WITH MICROSCOPIC (NON AUTO)MANUAL RESULT
BILIRUBIN UA: NEGATIVE
Blood, UA: NEGATIVE
CRYSTALS: 0
Epithelial cells, urine per micros: 0
GLUCOSE UA: NEGATIVE
Ketones, UA: NEGATIVE
Mucus, UA: 0
Nitrite, UA: NEGATIVE
Protein, UA: NEGATIVE
RBC: 2 M/uL — AB (ref 4.04–5.48)
UROBILINOGEN UA: 0.2 U/dL
WBC Casts, UA: 2
pH, UA: 8 (ref 5.0–8.0)

## 2016-09-21 MED ORDER — NAPROXEN 500 MG PO TABS: 500.0000 mg | ORAL_TABLET | Freq: Two times a day (BID) | ORAL | 12 refills | Status: DC

## 2016-09-21 MED ORDER — SULFAMETHOXAZOLE-TRIMETHOPRIM 800-160 MG PO TABS: 1.0000 | ORAL_TABLET | Freq: Two times a day (BID) | ORAL | 0 refills | Status: DC

## 2016-09-21 NOTE — Progress Notes (Signed)
Date:  09/21/2016   Name:  Renee Gomez   DOB:  May 25, 1971   MRN:  [id number]   Chief Complaint: Urinary Tract Infection (X 3 days- excessive urination but not a lot is coming. Pain during urination along with itching, and burning.) Urinary Tract Infection   This is a new problem. The current episode started in the past 7 days. The problem occurs every urination. The problem has been unchanged. The quality of the pain is described as burning. The patient is experiencing no pain. There has been no fever. Associated symptoms include frequency and urgency. Pertinent negatives include no hematuria. She has tried nothing for the symptoms.  Hip Pain   The pain is present in the right hip. The quality of the pain is described as aching. The pain is moderate. The pain has been fluctuating since onset. She has tried NSAIDs for the symptoms. The treatment provided moderate (needs refill on naproxen) relief.      Review of Systems  Constitutional: Negative for diaphoresis and fever.  Respiratory: Negative for chest tightness and shortness of breath.   Cardiovascular: Negative for chest pain and palpitations.  Genitourinary: Positive for frequency and urgency. Negative for difficulty urinating, dysuria and hematuria.  Musculoskeletal: Positive for arthralgias.    Patient Active Problem List   Diagnosis Date Noted  . Low back pain with sciatica 01/18/2015  . Fibrocystic breast 01/18/2015  . Acid reflux 01/18/2015  . Arthralgia of hip 01/18/2015  . Hyperlipidemia, mild 01/18/2015  . Knee pain, right 01/18/2015  . Tendinitis of elbow or forearm 01/18/2015    Prior to Admission medications   Not on File    No Known Allergies  Past Surgical History:  Procedure Laterality Date  . APPENDECTOMY    . BREAST EXCISIONAL BIOPSY Bilateral 25+yrs ago   neg  . LITHOTRIPSY  1995  . MINOR BREAST BIOPSY Bilateral    neg x 2  . TOTAL HIP ARTHROPLASTY Right 12/2012    Social History    Substance Use Topics  . Smoking status: Never Smoker  . Smokeless tobacco: Never Used  . Alcohol use No     Medication list has been reviewed and updated.   Physical Exam  Constitutional: She appears well-developed and well-nourished.  Cardiovascular: Normal rate, regular rhythm and normal heart sounds.   Pulmonary/Chest: Effort normal and breath sounds normal. No respiratory distress.  Abdominal: Soft. Bowel sounds are normal. There is no tenderness. There is no rebound, no guarding and no CVA tenderness.  Psychiatric: She has a normal mood and affect.  Nursing note and vitals reviewed.   BP 118/78 (BP Location: Right Arm, Patient Position: Sitting, Cuff Size: Normal)   Pulse 70   Temp 97.6 F (36.4 C)   Ht 5\' 1"  (1.549 m)   Wt 124 lb (56.2 kg)   LMP 09/07/2016 (Approximate)   SpO2 100%   BMI 23.43 kg/m   Assessment and Plan: 1. Dysuria Continue fluids Will treat with short course of Bactrim - POC urinalysis w microscopic (non auto) - sulfamethoxazole-trimethoprim (BACTRIM DS,SEPTRA DS) 800-160 MG tablet; Take 1 tablet by mouth 2 (two) times daily.  Dispense: 10 tablet; Refill: 0  2. Pain of both hip joints - naproxen (NAPROSYN) 500 MG tablet; Take 1 tablet (500 mg total) by mouth 2 (two) times daily with a meal.  Dispense: 60 tablet; Refill: 12   Meds ordered this encounter  Medications  . sulfamethoxazole-trimethoprim (BACTRIM DS,SEPTRA DS) 800-160 MG tablet    Sig:  Take 1 tablet by mouth 2 (two) times daily.    Dispense:  10 tablet    Refill:  0  . naproxen (NAPROSYN) 500 MG tablet    Sig: Take 1 tablet (500 mg total) by mouth 2 (two) times daily with a meal.    Dispense:  60 tablet    Refill:  [place], MD [facility]  09/21/2016

## 2016-10-01 ENCOUNTER — Other Ambulatory Visit: Payer: Self-pay | Admitting: Internal Medicine

## 2016-10-01 DIAGNOSIS — R300 Dysuria: Secondary | ICD-10-CM

## 2016-10-02 ENCOUNTER — Other Ambulatory Visit: Payer: Self-pay

## 2016-10-02 MED ORDER — AMOXICILLIN 500 MG PO CAPS: 2000.0000 mg | ORAL_CAPSULE | Freq: Once | ORAL | 1 refills | Status: DC

## 2017-01-22 ENCOUNTER — Other Ambulatory Visit: Payer: Self-pay | Admitting: Internal Medicine

## 2017-01-22 DIAGNOSIS — Z1231 Encounter for screening mammogram for malignant neoplasm of breast: Secondary | ICD-10-CM

## 2017-02-05 ENCOUNTER — Encounter

## 2017-02-22 ENCOUNTER — Ambulatory Visit
Admission: RE | Admit: 2017-02-22 | Discharge: 2017-02-22 | Disposition: A | Discharge status: Home / Self Care | Source: Ambulatory Visit | Attending: Internal Medicine | Admitting: Internal Medicine

## 2017-02-22 DIAGNOSIS — Z1231 Encounter for screening mammogram for malignant neoplasm of breast: Secondary | ICD-10-CM

## 2017-03-10 ENCOUNTER — Ambulatory Visit (INDEPENDENT_AMBULATORY_CARE_PROVIDER_SITE_OTHER): Admitting: Internal Medicine

## 2017-03-10 ENCOUNTER — Encounter: Payer: Self-pay | Admitting: Internal Medicine

## 2017-03-10 VITALS — BP 120/80 | HR 78 | Ht 61.0 in | Wt 120.0 lb

## 2017-03-10 DIAGNOSIS — F331 Major depressive disorder, recurrent, moderate: Secondary | ICD-10-CM | POA: Diagnosis not present

## 2017-03-10 DIAGNOSIS — E785 Hyperlipidemia, unspecified: Secondary | ICD-10-CM | POA: Diagnosis not present

## 2017-03-10 DIAGNOSIS — Z23 Encounter for immunization: Secondary | ICD-10-CM

## 2017-03-10 DIAGNOSIS — Z0000 Encounter for general adult medical examination without abnormal findings: Secondary | ICD-10-CM

## 2017-03-10 DIAGNOSIS — Z1239 Encounter for other screening for malignant neoplasm of breast: Secondary | ICD-10-CM

## 2017-03-10 HISTORY — DX: Major depressive disorder, recurrent, moderate: F33.1

## 2017-03-10 LAB — POCT URINALYSIS DIPSTICK
Bilirubin, UA: NEGATIVE
Blood, UA: NEGATIVE
Glucose, UA: NEGATIVE
KETONES UA: NEGATIVE
Leukocytes, UA: NEGATIVE
Nitrite, UA: NEGATIVE
PH UA: 7.5 (ref 5.0–8.0)
PROTEIN UA: NEGATIVE
SPEC GRAV UA: 1.01 (ref 1.010–1.025)
UROBILINOGEN UA: 0.2 U/dL

## 2017-03-10 MED ORDER — ESCITALOPRAM OXALATE 10 MG PO TABS: 10.0000 mg | ORAL_TABLET | Freq: Every day | ORAL | 5 refills | Status: DC

## 2017-03-10 NOTE — Patient Instructions (Addendum)
[facility]:   [place],  [place], and [facility] - all in [place] Breast self-awareness means being familiar with how your breasts look and feel. It involves checking your breasts regularly and reporting any changes to your health care provider. Practicing breast self-awareness is important. A change in your breasts can be a sign of a serious medical problem. Being familiar with how your breasts look and feel allows you to find any problems early, when treatment is more likely to be successful. All women should practice breast self-awareness, including women who have had breast implants. How to do a breast self-exam One way to learn what is normal for your breasts and whether your breasts are changing is to do a breast self-exam. To do a breast self-exam: Look for Changes  1. Remove all the clothing above your waist. 2. Stand in front of a mirror in a room with good lighting. 3. Put your hands on your hips. 4. Push your hands firmly downward. 5. Compare your breasts in the mirror. Look for differences between them (asymmetry), such as: ? Differences in shape. ? Differences in size. ? Puckers, dips, and bumps in one breast and not the other. 6. Look at each breast for changes in your skin, such as: ? Redness. ? Scaly areas. 7. Look for changes in your nipples, such as: ? Discharge. ? Bleeding. ? Dimpling. ? Redness. ? A change in position. Feel for Changes  Carefully feel your breasts for lumps and changes. It is best to do this while lying on your back on the floor and again while sitting or standing in the shower or tub with soapy water on your skin. Feel each breast in the following way:  Place the arm on the side of the breast you are examining above your head.  Feel your breast with the other hand.  Start in the nipple area and make  inch (2 cm) overlapping circles to feel your  breast. Use the pads of your three middle fingers to do this. Apply light pressure, then medium pressure, then firm pressure. The light pressure will allow you to feel the tissue closest to the skin. The medium pressure will allow you to feel the tissue that is a little deeper. The firm pressure will allow you to feel the tissue close to the ribs.  Continue the overlapping circles, moving downward over the breast until you feel your ribs below your breast.  Move one finger-width toward the center of the body. Continue to use the  inch (2 cm) overlapping circles to feel your breast as you move slowly up toward your collarbone.  Continue the up and down exam using all three pressures until you reach your armpit.  Write Down What You Find  Write down what is normal for each breast and any changes that you find. Keep a written record with breast changes or normal findings for each breast. By writing this information down, you do not need to depend only on memory for size, tenderness, or location. Write down where you are in your menstrual cycle, if you are still menstruating. If you are having trouble noticing differences in your breasts, do not get discouraged. With time you will become more familiar with the variations in your breasts and more comfortable with the exam. How often should I examine my breasts? Examine your breasts every month. If you are breastfeeding, the best time to examine your breasts is after  a feeding or after using a breast pump. If you menstruate, the best time to examine your breasts is 5-7 days after your period is over. During your period, your breasts are lumpier, and it may be more difficult to notice changes. When should I see my health care provider? See your health care provider if you notice:  A change in shape or size of your breasts or nipples.  A change in the skin of your breast or nipples, such as a reddened or scaly area.  Unusual discharge from your  nipples.  A lump or thick area that was not there before.  Pain in your breasts.  Anything that concerns you.  This information is not intended to replace advice given to you by your health care provider. Make sure you discuss any questions you have with your health care provider. Document Released: 05/25/2005 Document Revised: 10/31/2015 Document Reviewed: 04/14/2015 Elsevier Interactive Patient Education  [employer].

## 2017-03-10 NOTE — Progress Notes (Signed)
Date:  03/10/2017   Name:  Renee Gomez   DOB:  Nov 03, 1970   MRN:  [id number]   Chief Complaint: Annual Exam (Breast Exam. Flu Vaccine) Renee Gomez is a 46 y.o. female who presents today for her Complete Annual Exam. She feels fairly well. She reports exercising swimming. She reports she is sleeping fairly well - about 6 hours per night.   Hip Pain   There was no injury mechanism (known avascular necrosis). The pain is present in the left hip. The quality of the pain is described as aching. The pain is moderate. She has tried NSAIDs for the symptoms.  Depression         This is a recurrent problem.  The onset quality is gradual.   The problem has been gradually worsening since onset.  Associated symptoms include fatigue, irritable and sad.  Associated symptoms include no headaches and no suicidal ideas.     The symptoms are aggravated by social issues and family issues (issues getting autistic son declared disabled, dismissed from pediatrics due to [employer] coverage, and mother with dementia in NH in [place] and not doing well).  Treatments tried: took medication in the past - unsure what.     Review of Systems  Constitutional: Positive for fatigue. Negative for chills and fever.  HENT: Negative for congestion, hearing loss, tinnitus, trouble swallowing and voice change.   Eyes: Negative for visual disturbance.  Respiratory: Negative for cough, chest tightness, shortness of breath and wheezing.   Cardiovascular: Negative for chest pain, palpitations and leg swelling.  Gastrointestinal: Negative for abdominal pain, constipation, diarrhea and vomiting.  Endocrine: Negative for polydipsia and polyuria.  Genitourinary: Negative for dysuria, frequency, genital sores, vaginal bleeding and vaginal discharge.  Musculoskeletal: Positive for arthralgias (hip AVNright hip). Negative for gait problem and joint swelling.  Skin: Negative for color change and rash.  Neurological: Negative for  dizziness, tremors, light-headedness and headaches.  Hematological: Negative for adenopathy. Does not bruise/bleed easily.  Psychiatric/Behavioral: Positive for depression. Negative for dysphoric mood, sleep disturbance and suicidal ideas. The patient is not nervous/anxious.     Patient Active Problem List   Diagnosis Date Noted  . Low back pain with sciatica 01/18/2015  . Fibrocystic breast 01/18/2015  . Acid reflux 01/18/2015  . Arthralgia of hip 01/18/2015  . Hyperlipidemia, mild 01/18/2015  . Knee pain, right 01/18/2015  . Tendinitis of elbow or forearm 01/18/2015    Prior to Admission medications   Not on File    No Known Allergies  Past Surgical History:  Procedure Laterality Date  . APPENDECTOMY    . BREAST EXCISIONAL BIOPSY Bilateral 25+yrs ago   neg  . LITHOTRIPSY  1995  . MINOR BREAST BIOPSY Bilateral    neg x 2  . TOTAL HIP ARTHROPLASTY Right 12/2012    Social History  Substance Use Topics  . Smoking status: Never Smoker  . Smokeless tobacco: Never Used  . Alcohol use No     Medication list has been reviewed and updated.  PHQ 2/9 Scores 03/10/2017 08/28/2015  PHQ - 2 Score 2 0  PHQ- 9 Score 3 -    Physical Exam  Constitutional: She is oriented to person, place, and time. She appears well-developed and well-nourished. She is irritable. No distress.  HENT:  Head: Normocephalic and atraumatic.  Right Ear: Tympanic membrane and ear canal normal.  Left Ear: Tympanic membrane and ear canal normal.  Nose: Right sinus exhibits no maxillary sinus tenderness. Left  sinus exhibits no maxillary sinus tenderness.  Mouth/Throat: Uvula is midline and oropharynx is clear and moist.  Eyes: Conjunctivae and EOM are normal. Right eye exhibits no discharge. Left eye exhibits no discharge. No scleral icterus.  Neck: Normal range of motion. Carotid bruit is not present. No erythema present. No thyromegaly present.  Cardiovascular: Normal rate, regular rhythm, normal heart  sounds and normal pulses.   Pulmonary/Chest: Effort normal. No respiratory distress. She has no wheezes. Right breast exhibits no mass, no nipple discharge, no skin change and no tenderness. Left breast exhibits no mass, no nipple discharge, no skin change and no tenderness.  Abdominal: Soft. Bowel sounds are normal. There is no hepatosplenomegaly. There is no tenderness. There is no CVA tenderness.  Musculoskeletal: She exhibits no edema.       Right knee: She exhibits no swelling and no effusion.       Left knee: She exhibits no swelling and no effusion.  Lymphadenopathy:    She has no cervical adenopathy.    She has no axillary adenopathy.  Neurological: She is alert and oriented to person, place, and time. She has normal reflexes. No cranial nerve deficit or sensory deficit.  Skin: Skin is warm, dry and intact. No rash noted.  Psychiatric: Her speech is normal and behavior is normal. Judgment and thought content normal. Cognition and memory are normal. She exhibits a depressed mood.  Nursing note and vitals reviewed.   BP 120/80   Pulse 78   Ht 5\' 1"  (1.549 m)   Wt 120 lb (54.4 kg)   LMP 02/15/2017 (Exact Date)   SpO2 98%   BMI 22.67 kg/m   Assessment and Plan: 1. Annual physical exam - CBC with Differential/Platelet - POCT urinalysis dipstick  2. Breast cancer screening Recent mammogram normal  3. Need for influenza vaccination - Flu Vaccine QUAD 36+ mos IM  4. Hyperlipidemia, mild Continue healthy diet - Comprehensive metabolic panel - Lipid panel  5. Moderate episode of recurrent major depressive disorder ([place]) Begin medication; recheck in 3 months or sooner if needed - TSH - escitalopram (LEXAPRO) 10 MG tablet; Take 1 tablet (10 mg total) by mouth daily.  Dispense: 30 tablet; Refill: 5   Meds ordered this encounter  Medications  . escitalopram (LEXAPRO) 10 MG tablet    Sig: Take 1 tablet (10 mg total) by mouth daily.    Dispense:  30 tablet    Refill:  5     Partially dictated using Editor, commissioning. Any errors are unintentional.  Halina Maidens, MD [facility]  03/10/2017

## 2017-03-11 LAB — COMPREHENSIVE METABOLIC PANEL
A/G RATIO: 1.5 (ref 1.2–2.2)
ALBUMIN: 4.4 g/dL (ref 3.5–5.5)
ALT: 8 IU/L (ref 0–32)
AST: 19 IU/L (ref 0–40)
Alkaline Phosphatase: 49 IU/L (ref 39–117)
BUN / CREAT RATIO: 13 (ref 9–23)
BUN: 8 mg/dL (ref 6–24)
Bilirubin Total: 0.4 mg/dL (ref 0.0–1.2)
CALCIUM: 9.4 mg/dL (ref 8.7–10.2)
CO2: 22 mmol/L (ref 20–29)
Chloride: 102 mmol/L (ref 96–106)
Creatinine, Ser: 0.62 mg/dL (ref 0.57–1.00)
GFR, EST AFRICAN AMERICAN: 123 mL/min/{1.73_m2} (ref >59)
GFR, EST NON AFRICAN AMERICAN: 107 mL/min/{1.73_m2} (ref >59)
Globulin, Total: 3 g/dL (ref 1.5–4.5)
Glucose: 80 mg/dL (ref 65–99)
POTASSIUM: 4.3 mmol/L (ref 3.5–5.2)
Sodium: 138 mmol/L (ref 134–144)
TOTAL PROTEIN: 7.4 g/dL (ref 6.0–8.5)

## 2017-03-11 LAB — CBC WITH DIFFERENTIAL/PLATELET
BASOS: 0 %
Basophils Absolute: 0 10*3/uL (ref 0.0–0.2)
EOS (ABSOLUTE): 0.1 10*3/uL (ref 0.0–0.4)
EOS: 1 %
HEMATOCRIT: 36.8 % (ref 34.0–46.6)
HEMOGLOBIN: 11.3 g/dL (ref 11.1–15.9)
IMMATURE GRANS (ABS): 0 10*3/uL (ref 0.0–0.1)
IMMATURE GRANULOCYTES: 0 %
LYMPHS: 39 %
Lymphocytes Absolute: 2.5 10*3/uL (ref 0.7–3.1)
MCH: 25.8 pg — AB (ref 26.6–33.0)
MCHC: 30.7 g/dL — ABNORMAL LOW (ref 31.5–35.7)
MCV: 84 fL (ref 79–97)
MONOCYTES: 6 %
Monocytes Absolute: 0.4 10*3/uL (ref 0.1–0.9)
NEUTROS ABS: 3.4 10*3/uL (ref 1.4–7.0)
NEUTROS PCT: 54 %
PLATELETS: 276 10*3/uL (ref 150–379)
RBC: 4.38 x10E6/uL (ref 3.77–5.28)
RDW: 15.9 % — ABNORMAL HIGH (ref 12.3–15.4)
WBC: 6.4 10*3/uL (ref 3.4–10.8)

## 2017-03-11 LAB — LIPID PANEL
CHOL/HDL RATIO: 4.3 ratio (ref 0.0–4.4)
Cholesterol, Total: 237 mg/dL — ABNORMAL HIGH (ref 100–199)
HDL: 55 mg/dL (ref >39)
LDL Calculated: 162 mg/dL — ABNORMAL HIGH (ref 0–99)
Triglycerides: 100 mg/dL (ref 0–149)
VLDL CHOLESTEROL CAL: 20 mg/dL (ref 5–40)

## 2017-03-11 LAB — TSH: TSH: 1.58 u[IU]/mL (ref 0.450–4.500)

## 2017-05-17 IMAGING — US US PELVIS COMPLETE
1 series · 14 of 25 positions shown · non-contrast
Comparison: None

CLINICAL DATA: Left pelvic pain for 5 months.

EXAM:
TRANSABDOMINAL AND TRANSVAGINAL ULTRASOUND OF PELVIS
TECHNIQUE: Both transabdominal and transvaginal ultrasound examinations of the
pelvis were performed. Transabdominal technique was performed for
global imaging of the pelvis including uterus, ovaries, adnexal
regions, and pelvic cul-de-sac. It was necessary to proceed with
endovaginal exam following the transabdominal exam to visualize the
endometrium and ovaries.

[Series 1: us pelvis complete · 0.25mm/px · 14 of 65 slices shown]
[im 1/65]
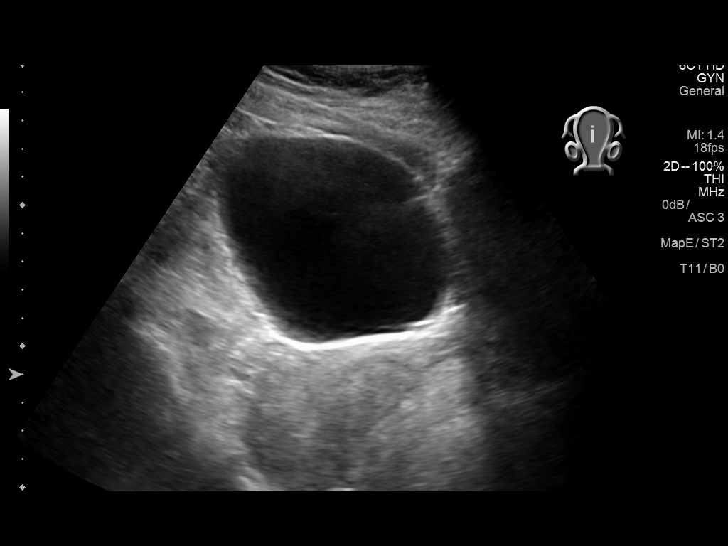
[im 6/65]
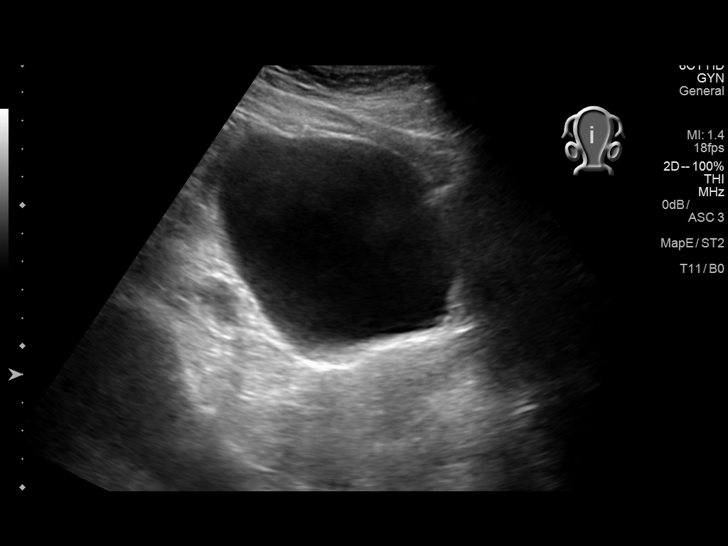
[im 11/65]
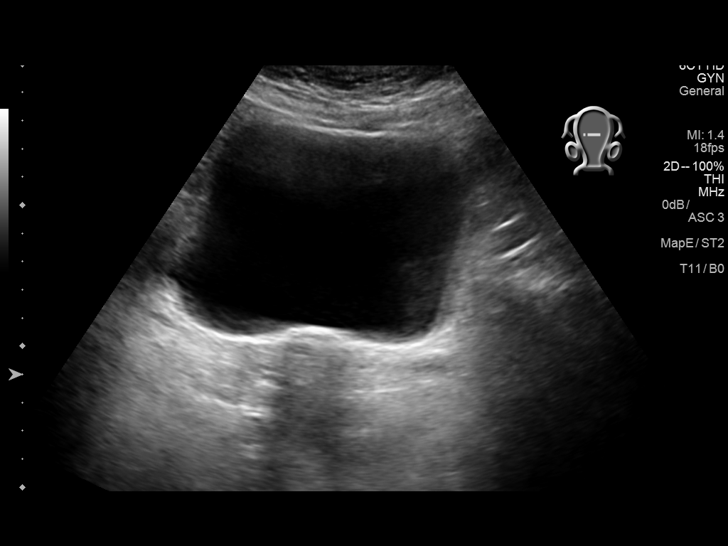
[im 17/65]
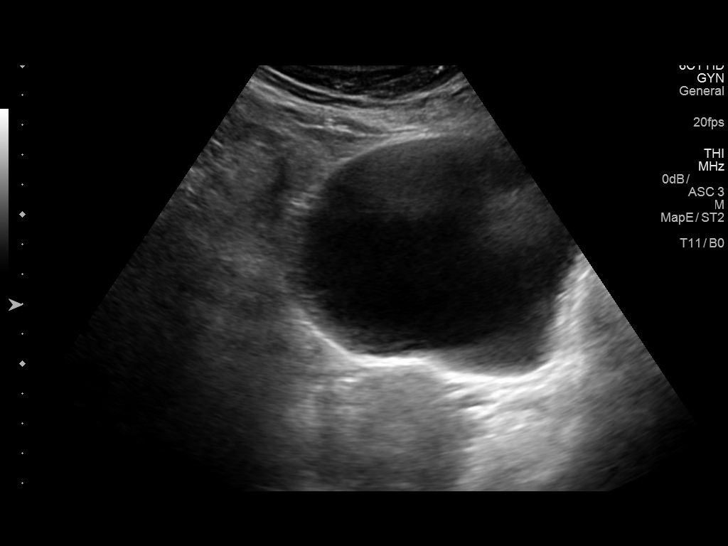
[im 22/65]
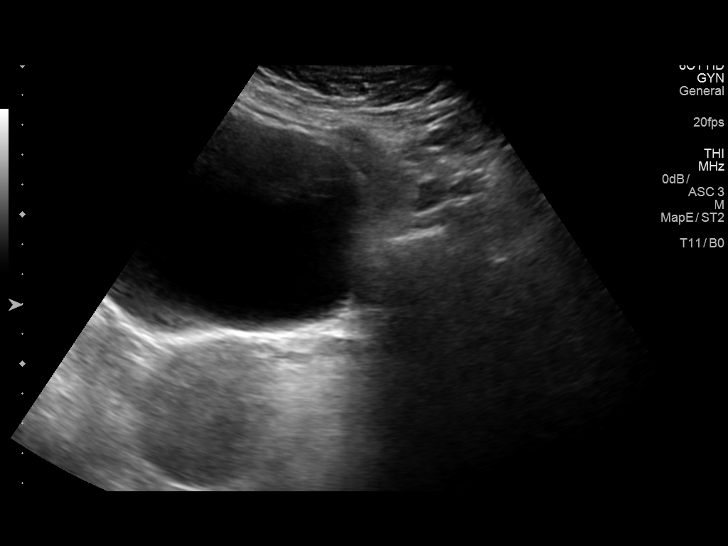
[im 25/65]
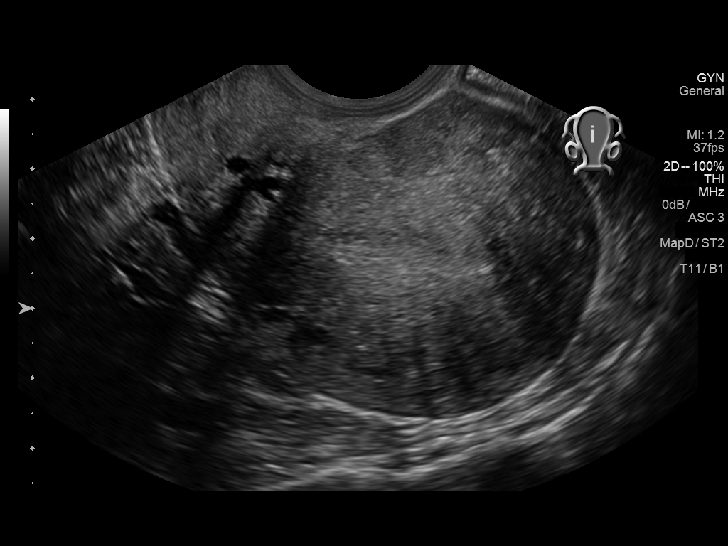
[im 30/65]
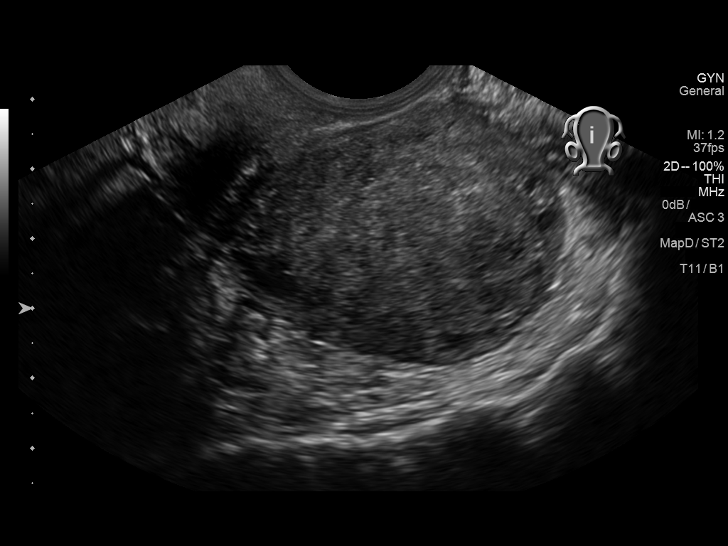
[im 35/65]
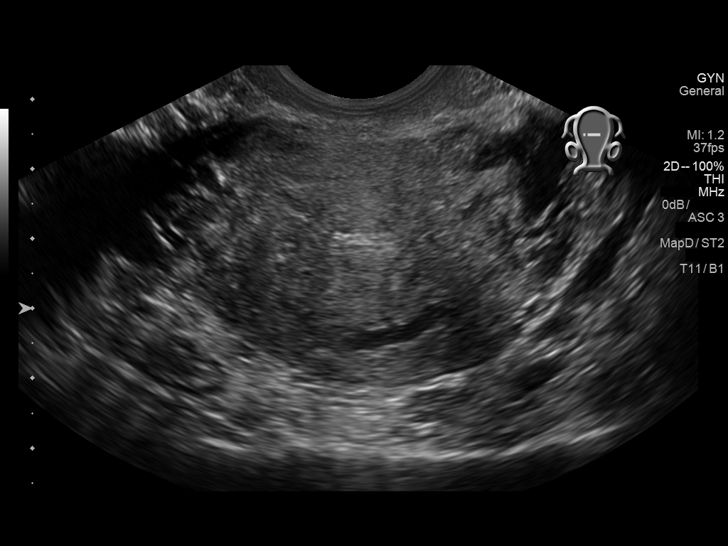
[im 41/65]
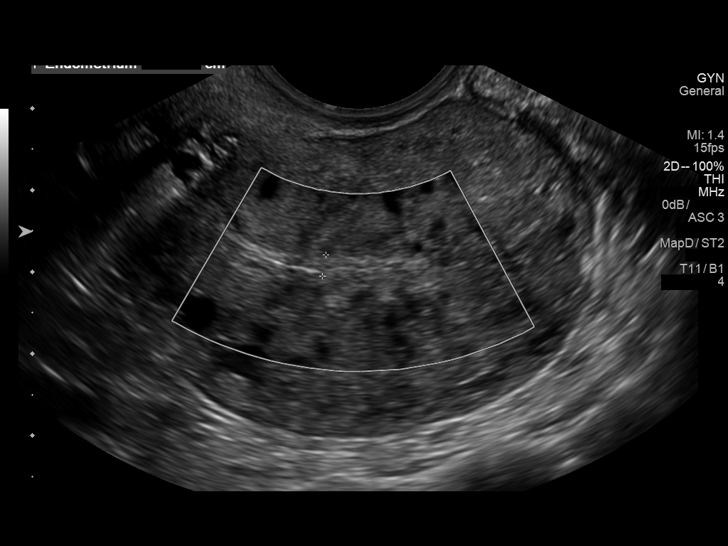
[im 43/65]
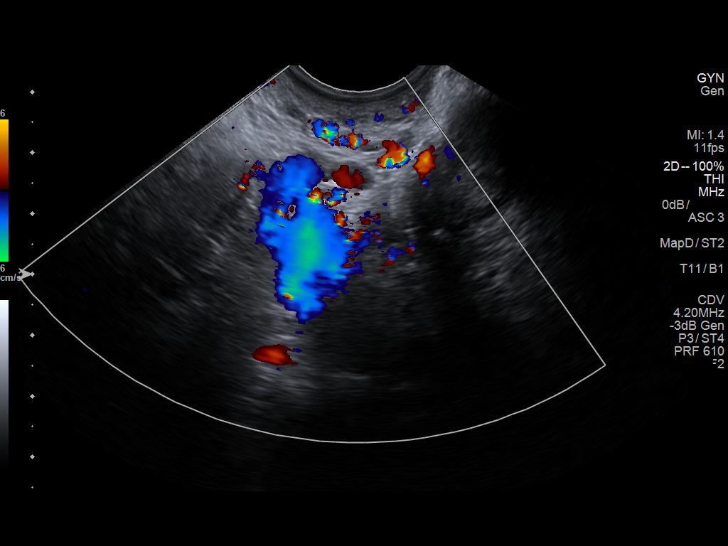
[im 49/65]
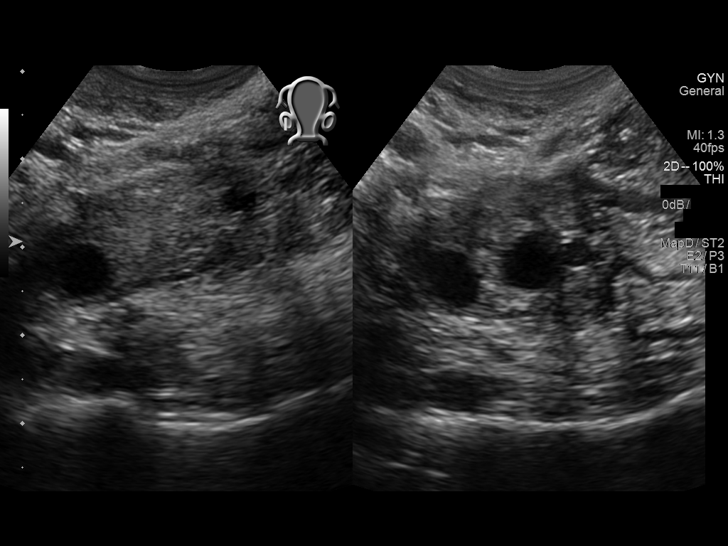
[im 54/65]
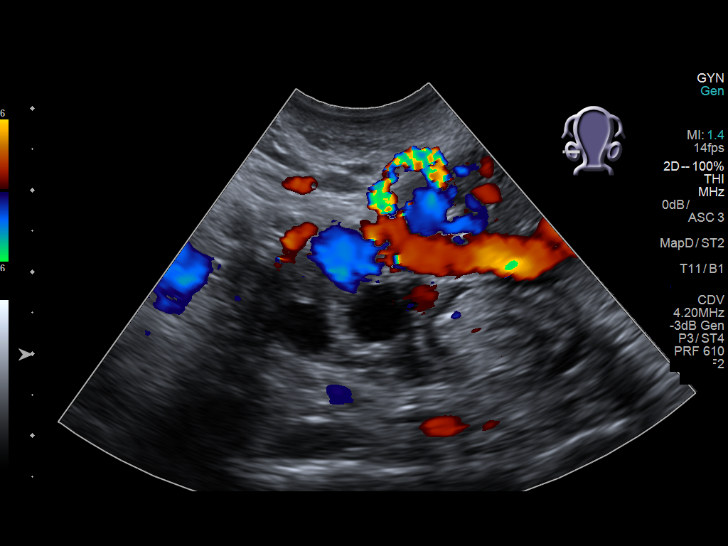
[im 59/65]
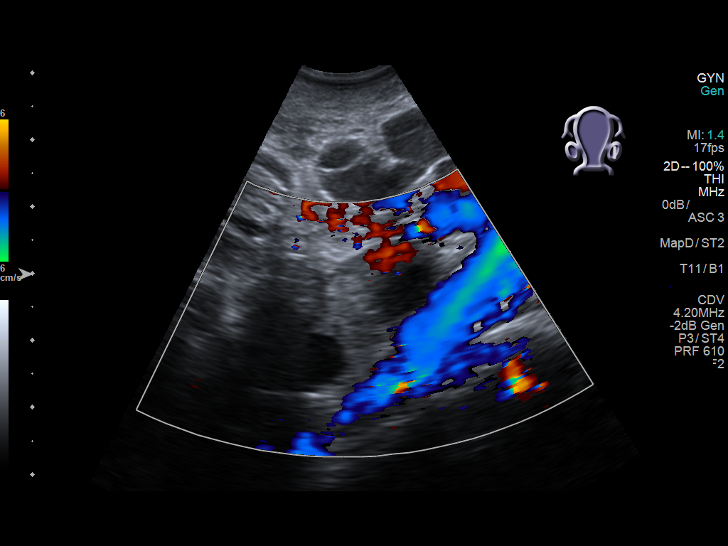
[im 65/65]
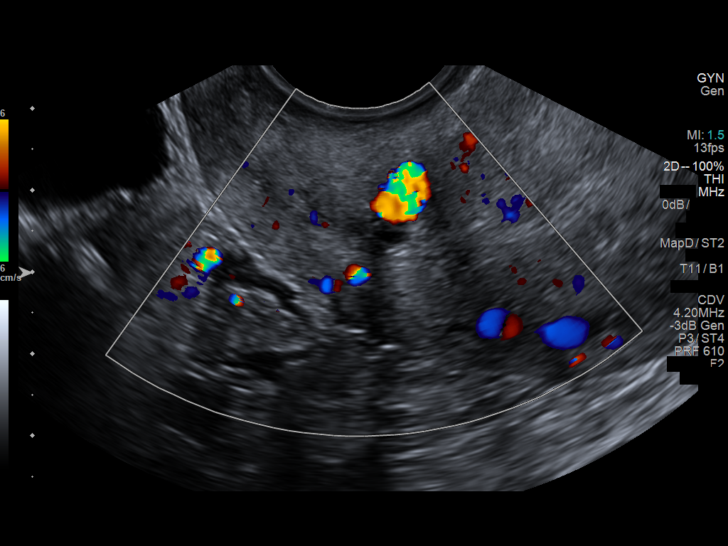

[14 of 25 positions shown; findings below may reference images not displayed]

FINDINGS: Uterus

Measurements: 7.4 x 4.9 x 4.0 cm. No fibroids or other mass
visualized.

Endometrium

Thickness: 6.6 mm which is within normal limits. No focal
abnormality visualized.

Right ovary

Measurements: 2.6 x 2.4 x 1.3 cm. Multiple follicular cysts are
noted.

Left ovary

Measurements: 3.0 x 3.2 x 1.5 cm. Multiple follicular cysts are
noted.

Other findings

No abnormal free fluid. Prominent venous structures are noted in the
pelvis suggesting pelvic congestion syndrome.
IMPRESSION: Prominent venous structures are seen in the pelvis suggesting pelvic
congestion syndrome. Endometrium and ovaries are within normal
limits.

## 2017-06-10 ENCOUNTER — Ambulatory Visit (INDEPENDENT_AMBULATORY_CARE_PROVIDER_SITE_OTHER): Admitting: Internal Medicine

## 2017-06-10 ENCOUNTER — Encounter: Payer: Self-pay | Admitting: Internal Medicine

## 2017-06-10 VITALS — BP 118/72 | HR 79 | Ht 61.0 in | Wt 122.0 lb

## 2017-06-10 DIAGNOSIS — F331 Major depressive disorder, recurrent, moderate: Secondary | ICD-10-CM | POA: Diagnosis not present

## 2017-06-10 DIAGNOSIS — R42 Dizziness and giddiness: Secondary | ICD-10-CM | POA: Diagnosis not present

## 2017-06-10 NOTE — Progress Notes (Signed)
Date:  06/10/2017   Name:  Renee Gomez   DOB:  June 13, 1970   MRN:  [id number]   Chief Complaint: Depression (Noticed when standing or sitting she is getting dizzy- did not know if this could be side affect of medication or not. ) and Hyperlipidemia  Depression         This is a new problem.  The current episode started more than 1 month ago.   The problem has been rapidly improving since onset.  Associated symptoms include no fatigue.  Past treatments include SSRIs - Selective serotonin reuptake inhibitors.  Compliance with treatment is good.  Past compliance problems: possible mild lightheadedness. Dizziness  This is a new problem. The current episode started more than 1 month ago. The problem occurs intermittently. The problem has been gradually worsening. Pertinent negatives include no chest pain, chills, coughing, fatigue, fever, visual change or vomiting.     Review of Systems  Constitutional: Negative for chills, fatigue and fever.  HENT: Positive for tinnitus. Negative for hearing loss.   Respiratory: Negative for cough and chest tightness.   Cardiovascular: Negative for chest pain and palpitations.  Gastrointestinal: Negative for vomiting.  Neurological: Positive for dizziness and light-headedness.  Psychiatric/Behavioral: Positive for depression.    Patient Active Problem List   Diagnosis Date Noted  . Moderate episode of recurrent major depressive disorder ([place]) 03/10/2017  . Low back pain with sciatica 01/18/2015  . Fibrocystic breast 01/18/2015  . Acid reflux 01/18/2015  . Arthralgia of hip 01/18/2015  . Hyperlipidemia, mild 01/18/2015  . Knee pain, right 01/18/2015  . Tendinitis of elbow or forearm 01/18/2015    Prior to Admission medications   Medication Sig Start Date End Date Taking? Authorizing Provider  escitalopram (LEXAPRO) 10 MG tablet Take 1 tablet (10 mg total) by mouth daily. 03/10/17  Yes Glean Hess, MD    No Known Allergies  Past  Surgical History:  Procedure Laterality Date  . APPENDECTOMY    . BREAST EXCISIONAL BIOPSY Bilateral 25+yrs ago   neg  . LITHOTRIPSY  1995  . MINOR BREAST BIOPSY Bilateral    neg x 2  . TOTAL HIP ARTHROPLASTY Right 12/2012    Social History   Tobacco Use  . Smoking status: Never Smoker  . Smokeless tobacco: Never Used  Substance Use Topics  . Alcohol use: No    Alcohol/week: 0.0 oz  . Drug use: No     Medication list has been reviewed and updated.  PHQ 2/9 Scores 06/10/2017 03/10/2017 08/28/2015  PHQ - 2 Score 0 2 0  PHQ- 9 Score 0 3 -    Physical Exam  Constitutional: She is oriented to person, place, and time. She appears well-developed. No distress.  HENT:  Head: Normocephalic and atraumatic.  Right Ear: Tympanic membrane and ear canal normal.  Left Ear: Tympanic membrane and ear canal normal.  Mouth/Throat: No posterior oropharyngeal edema or posterior oropharyngeal erythema.  Eyes: Conjunctivae and EOM are normal. Pupils are equal, round, and reactive to light.  Neck: Normal range of motion. Neck supple. No thyromegaly present.  Cardiovascular: Normal rate, regular rhythm and normal heart sounds.  Pulmonary/Chest: Effort normal and breath sounds normal. No respiratory distress. She has no decreased breath sounds. She has no wheezes.  Musculoskeletal: Normal range of motion.  Neurological: She is alert and oriented to person, place, and time.  Skin: Skin is warm and dry. No rash noted.  Psychiatric: She has a normal mood and affect. Her  behavior is normal. Thought content normal.  Nursing note and vitals reviewed.   BP 118/72   Pulse 79   Ht 5\' 1"  (1.549 m)   Wt 122 lb (55.3 kg)   SpO2 98%   BMI 23.05 kg/m   Assessment and Plan: 1. Moderate episode of recurrent major depressive disorder (H[facility]) Doing well on Lexapro - continue  2. Vertigo Precautions given Recommend ENT evaluation if persistent   No orders of the defined types were placed in this  encounter.   Partially dictated using Editor, commissioning. Any errors are unintentional.  Halina Maidens, MD [facility]  06/10/2017

## 2017-07-21 ENCOUNTER — Ambulatory Visit (INDEPENDENT_AMBULATORY_CARE_PROVIDER_SITE_OTHER): Admitting: Internal Medicine

## 2017-07-21 ENCOUNTER — Encounter: Payer: Self-pay | Admitting: Internal Medicine

## 2017-07-21 VITALS — BP 92/60 | HR 72 | Temp 98.2°F | Ht 61.0 in | Wt 124.0 lb

## 2017-07-21 DIAGNOSIS — H109 Unspecified conjunctivitis: Secondary | ICD-10-CM

## 2017-07-21 DIAGNOSIS — J0100 Acute maxillary sinusitis, unspecified: Secondary | ICD-10-CM | POA: Diagnosis not present

## 2017-07-21 MED ORDER — NEOMYCIN-POLYMYXIN-DEXAMETH 3.5-10000-0.1 OP SUSP: 2.0000 [drp] | Freq: Four times a day (QID) | OPHTHALMIC | 0 refills | Status: DC

## 2017-07-21 MED ORDER — AZITHROMYCIN 250 MG PO TABS: ORAL_TABLET | ORAL | 0 refills | Status: AC

## 2017-07-21 NOTE — Progress Notes (Addendum)
Date:  07/21/2017   Name:  Renee Gomez   DOB:  21-Jun-1970   MRN:  [id number]   Chief Complaint: Conjunctivitis (X 2 days left eye has been pink and painful. Watering. ) and Cough (Started X3 days. Throat hurting. Headache. Body Aches. Cough with slight mucous. )  Conjunctivitis   The current episode started 2 days ago. The onset was sudden. The problem has been unchanged. The problem is moderate. Nothing relieves the symptoms. Associated symptoms include photophobia, headaches, sore throat, cough, eye discharge and eye redness. Pertinent negatives include no fever, no ear discharge, no ear pain and no wheezing. The eye pain is moderate.  Cough  This is a new problem. The current episode started in the past 7 days. The cough is non-productive. Associated symptoms include chills, eye redness, headaches and a sore throat. Pertinent negatives include no chest pain, ear pain, fever, nasal congestion, shortness of breath or wheezing. She has tried nothing for the symptoms.      Review of Systems  Constitutional: Positive for chills. Negative for fever and unexpected weight change.  HENT: Positive for sore throat. Negative for ear discharge, ear pain and trouble swallowing.   Eyes: Positive for photophobia, discharge and redness.  Respiratory: Positive for cough. Negative for chest tightness, shortness of breath and wheezing.   Cardiovascular: Negative for chest pain and palpitations.  Neurological: Positive for headaches.    Patient Active Problem List   Diagnosis Date Noted  . Moderate episode of recurrent major depressive disorder ([place]) 03/10/2017  . Low back pain with sciatica 01/18/2015  . Fibrocystic breast 01/18/2015  . Acid reflux 01/18/2015  . Arthralgia of hip 01/18/2015  . Hyperlipidemia, mild 01/18/2015  . Knee pain, right 01/18/2015  . Tendinitis of elbow or forearm 01/18/2015    Prior to Admission medications   Medication Sig Start Date End Date Taking? Authorizing  Provider  escitalopram (LEXAPRO) 10 MG tablet Take 1 tablet (10 mg total) by mouth daily. Patient not taking: Reported on 07/21/2017 03/10/17   Glean Hess, MD    No Known Allergies  Past Surgical History:  Procedure Laterality Date  . APPENDECTOMY    . BREAST EXCISIONAL BIOPSY Bilateral 25+yrs ago   neg  . LITHOTRIPSY  1995  . MINOR BREAST BIOPSY Bilateral    neg x 2  . TOTAL HIP ARTHROPLASTY Right 12/2012    Social History   Tobacco Use  . Smoking status: Never Smoker  . Smokeless tobacco: Never Used  Substance Use Topics  . Alcohol use: No    Alcohol/week: 0.0 oz  . Drug use: No     Medication list has been reviewed and updated.  PHQ 2/9 Scores 06/10/2017 03/10/2017 08/28/2015  PHQ - 2 Score 0 2 0  PHQ- 9 Score 0 3 -    Physical Exam  Constitutional: She is oriented to person, place, and time. She appears well-developed and well-nourished.  HENT:  Right Ear: External ear and ear canal normal.  Left Ear: External ear and ear canal normal.  Nose: Right sinus exhibits no maxillary sinus tenderness. Left sinus exhibits maxillary sinus tenderness.  Mouth/Throat: Uvula is midline and mucous membranes are normal. No oral lesions. Posterior oropharyngeal erythema present. No oropharyngeal exudate.  Eyes: EOM are normal. Right eye exhibits no chemosis, no discharge and no exudate. Left eye exhibits chemosis. Left conjunctiva is injected. Left conjunctiva has no hemorrhage.  Cardiovascular: Normal rate, regular rhythm and normal heart sounds.  Pulmonary/Chest: Breath sounds normal.  She has no decreased breath sounds. She has no wheezes. She has no rales.  Lymphadenopathy:    She has no cervical adenopathy.  Neurological: She is alert and oriented to person, place, and time.    BP 92/60   Pulse 72   Temp 98.2 F (36.8 C) (Oral)   Ht 5\' 1"  (1.549 m)   Wt 124 lb (56.2 kg)   SpO2 98%   BMI 23.43 kg/m   Assessment and Plan: 1. Acute non-recurrent maxillary  sinusitis - azithromycin (ZITHROMAX Z-PAK) 250 MG tablet; UAD  Dispense: 6 each; Refill: 0  2. Conjunctivitis of left eye, unspecified conjunctivitis type - neomycin-polymyxin b-dexamethasone (MAXITROL) 3.5-10000-0.1 SUSP; Place 2 drops into both eyes every 6 (six) hours.  Dispense: 5 mL; Refill: 0   Meds ordered this encounter  Medications  . azithromycin (ZITHROMAX Z-PAK) 250 MG tablet    Sig: UAD    Dispense:  6 each    Refill:  0  . neomycin-polymyxin b-dexamethasone (MAXITROL) 3.5-10000-0.1 SUSP    Sig: Place 2 drops into both eyes every 6 (six) hours.    Dispense:  5 mL    Refill:  0    Partially dictated using Editor, commissioning. Any errors are unintentional.  Halina Maidens, MD [facility]  07/21/2017

## 2017-08-13 ENCOUNTER — Encounter: Payer: Self-pay | Admitting: Internal Medicine

## 2017-08-13 ENCOUNTER — Ambulatory Visit (INDEPENDENT_AMBULATORY_CARE_PROVIDER_SITE_OTHER): Admitting: Internal Medicine

## 2017-08-13 VITALS — BP 118/70 | HR 77 | Ht 61.0 in | Wt 123.0 lb

## 2017-08-13 DIAGNOSIS — R42 Dizziness and giddiness: Secondary | ICD-10-CM

## 2017-08-13 MED ORDER — MECLIZINE HCL 25 MG PO TABS: 25.0000 mg | ORAL_TABLET | Freq: Three times a day (TID) | ORAL | 1 refills | Status: DC | PRN

## 2017-08-13 NOTE — Progress Notes (Signed)
Date:  08/13/2017   Name:  Renee Gomez   DOB:  1971/01/13   MRN:  [id number]   Chief Complaint: Dizziness (States this started Wednesday morning and been bad since. Patient is very unstable when walking alone. She feels when she turns her head anytime she gets exremely dizzy and could fall. Tried to get her weight before rooming her and she almost fell off machine. )  Dizziness  This is a new problem. The current episode started yesterday. The problem occurs constantly. The problem has been unchanged. Associated symptoms include nausea and vertigo. Pertinent negatives include no chest pain, chills, congestion, fever or vomiting.     Review of Systems  Constitutional: Negative for chills and fever.  HENT: Negative for congestion, ear pain, sinus pressure and sinus pain.   Respiratory: Negative for chest tightness and shortness of breath.   Cardiovascular: Negative for chest pain and palpitations.  Gastrointestinal: Positive for nausea. Negative for vomiting.  Neurological: Positive for dizziness and vertigo.    Patient Active Problem List   Diagnosis Date Noted  . Moderate episode of recurrent major depressive disorder ([place]) 03/10/2017  . Low back pain with sciatica 01/18/2015  . Fibrocystic breast 01/18/2015  . Acid reflux 01/18/2015  . Arthralgia of hip 01/18/2015  . Hyperlipidemia, mild 01/18/2015  . Knee pain, right 01/18/2015  . Tendinitis of elbow or forearm 01/18/2015    Prior to Admission medications   Not on File    No Known Allergies  Past Surgical History:  Procedure Laterality Date  . APPENDECTOMY    . BREAST EXCISIONAL BIOPSY Bilateral 25+yrs ago   neg  . LITHOTRIPSY  1995  . MINOR BREAST BIOPSY Bilateral    neg x 2  . TOTAL HIP ARTHROPLASTY Right 12/2012    Social History   Tobacco Use  . Smoking status: Never Smoker  . Smokeless tobacco: Never Used  Substance Use Topics  . Alcohol use: No    Alcohol/week: 0.0 oz  . Drug use: No      Medication list has been reviewed and updated.  PHQ 2/9 Scores 06/10/2017 03/10/2017 08/28/2015  PHQ - 2 Score 0 2 0  PHQ- 9 Score 0 3 -    Physical Exam  Constitutional: She is oriented to person, place, and time. She appears well-developed. No distress.  HENT:  Head: Normocephalic and atraumatic.  Cardiovascular: Normal rate, regular rhythm and normal heart sounds.  Pulmonary/Chest: Effort normal and breath sounds normal. No respiratory distress.  Musculoskeletal: Normal range of motion.  Neurological: She is alert and oriented to person, place, and time. She has normal strength. No sensory deficit. Coordination and gait normal.  Skin: Skin is warm and dry. No rash noted.  Psychiatric: She has a normal mood and affect. Her behavior is normal. Thought content normal.  Nursing note and vitals reviewed.   BP 118/70   Pulse 77   Ht 5\' 1"  (1.549 m)   Wt 123 lb (55.8 kg)   SpO2 99%   BMI 23.24 kg/m   Assessment and Plan: 1. Vertigo Cautions given - should improve over next week - meclizine (ANTIVERT) 25 MG tablet; Take 1 tablet (25 mg total) by mouth 3 (three) times daily as needed for dizziness.  Dispense: 30 tablet; Refill: 1   Meds ordered this encounter  Medications  . meclizine (ANTIVERT) 25 MG tablet    Sig: Take 1 tablet (25 mg total) by mouth 3 (three) times daily as needed for dizziness.  Dispense:  30 tablet    Refill:  1    Partially dictated using Editor, commissioning. Any errors are unintentional.  Halina Maidens, MD [facility]  08/13/2017

## 2017-10-07 ENCOUNTER — Other Ambulatory Visit: Payer: Self-pay | Admitting: Internal Medicine

## 2018-03-11 ENCOUNTER — Encounter: Admitting: Internal Medicine

## 2018-03-15 ENCOUNTER — Ambulatory Visit (INDEPENDENT_AMBULATORY_CARE_PROVIDER_SITE_OTHER): Admitting: Internal Medicine

## 2018-03-15 ENCOUNTER — Encounter: Payer: Self-pay | Admitting: Internal Medicine

## 2018-03-15 VITALS — BP 122/64 | HR 60 | Ht 61.0 in | Wt 123.0 lb

## 2018-03-15 DIAGNOSIS — M25551 Pain in right hip: Secondary | ICD-10-CM | POA: Diagnosis not present

## 2018-03-15 DIAGNOSIS — R42 Dizziness and giddiness: Secondary | ICD-10-CM

## 2018-03-15 DIAGNOSIS — M25552 Pain in left hip: Secondary | ICD-10-CM | POA: Diagnosis not present

## 2018-03-15 DIAGNOSIS — Z1231 Encounter for screening mammogram for malignant neoplasm of breast: Secondary | ICD-10-CM | POA: Diagnosis not present

## 2018-03-15 DIAGNOSIS — Z0000 Encounter for general adult medical examination without abnormal findings: Secondary | ICD-10-CM

## 2018-03-15 LAB — POCT URINALYSIS DIPSTICK
Bilirubin, UA: NEGATIVE
Glucose, UA: NEGATIVE
KETONES UA: NEGATIVE
Leukocytes, UA: NEGATIVE
NITRITE UA: NEGATIVE
PROTEIN UA: NEGATIVE
Spec Grav, UA: <=1.005 — AB (ref 1.010–1.025)
Urobilinogen, UA: 0.2 E.U./dL
pH, UA: 6 (ref 5.0–8.0)

## 2018-03-15 MED ORDER — GABAPENTIN 100 MG PO CAPS: 100.0000 mg | ORAL_CAPSULE | Freq: Every day | ORAL | 2 refills | Status: DC

## 2018-03-15 NOTE — Progress Notes (Signed)
Date:  03/15/2018   Name:  Renee Gomez   DOB:  20-Apr-1971   MRN:  [id number]   Chief Complaint: Annual Exam (Breast Exam.) Vanita Panda Tapscott is a 47 y.o. female who presents today for her Complete Annual Exam. She feels fairly well. She reports exercising swimming. She reports she is sleeping poorly. She is due now for mammogram but she wants to do every 2 years. She denies breast problems. Pap is due next year.   Knee Pain   There was no injury mechanism. The pain is present in the right knee and right hip. The pain is mild. The pain has been fluctuating since onset. She has tried NSAIDs for the symptoms.  Dizziness  This is a new problem. The problem occurs intermittently. The problem has been gradually improving. Pertinent negatives include no abdominal pain, arthralgias, chest pain, chills, congestion, coughing, fatigue, fever, headaches, joint swelling, rash or vomiting. The symptoms are aggravated by bending. Treatments tried: meclizine. The treatment provided moderate relief.    Review of Systems  Constitutional: Negative for chills, fatigue and fever.  HENT: Negative for congestion, hearing loss, tinnitus, trouble swallowing and voice change.   Eyes: Negative for visual disturbance.  Respiratory: Negative for cough, chest tightness, shortness of breath and wheezing.   Cardiovascular: Negative for chest pain, palpitations and leg swelling.  Gastrointestinal: Negative for abdominal pain, constipation, diarrhea and vomiting.  Endocrine: Negative for polydipsia and polyuria.  Genitourinary: Negative for dysuria, frequency, genital sores, vaginal bleeding and vaginal discharge.  Musculoskeletal: Negative for arthralgias, gait problem and joint swelling.  Skin: Negative for color change and rash.  Neurological: Negative for dizziness, tremors, light-headedness and headaches.  Hematological: Negative for adenopathy. Does not bruise/bleed easily.  Psychiatric/Behavioral: Negative  for dysphoric mood and sleep disturbance. The patient is not nervous/anxious.     Patient Active Problem List   Diagnosis Date Noted  . Low back pain with sciatica 01/18/2015  . Fibrocystic breast 01/18/2015  . Acid reflux 01/18/2015  . Arthralgia of hip 01/18/2015  . Hyperlipidemia, mild 01/18/2015  . Knee pain, right 01/18/2015  . Tendinitis of elbow or forearm 01/18/2015    No Known Allergies  Past Surgical History:  Procedure Laterality Date  . APPENDECTOMY    . BREAST EXCISIONAL BIOPSY Bilateral 25+yrs ago   neg  . LITHOTRIPSY  1995  . MINOR BREAST BIOPSY Bilateral    neg x 2  . TOTAL HIP ARTHROPLASTY Right 12/2012    Social History   Tobacco Use  . Smoking status: Never Smoker  . Smokeless tobacco: Never Used  Substance Use Topics  . Alcohol use: No    Alcohol/week: 0.0 standard drinks  . Drug use: No     Medication list has been reviewed and updated.  No outpatient medications have been marked as taking for the 03/15/18 encounter (Office Visit) with Glean Hess, MD.    [facility] 2/9 Scores 03/15/2018 06/10/2017 03/10/2017 08/28/2015  PHQ - 2 Score 3 0 2 0  PHQ- 9 Score 5 0 3 -    Physical Exam  Constitutional: She is oriented to person, place, and time. She appears well-developed and well-nourished. No distress.  HENT:  Head: Normocephalic and atraumatic.  Right Ear: Tympanic membrane and ear canal normal.  Left Ear: Tympanic membrane and ear canal normal.  Nose: Right sinus exhibits no maxillary sinus tenderness. Left sinus exhibits no maxillary sinus tenderness.  Mouth/Throat: Uvula is midline and oropharynx is clear and moist.  Eyes: Conjunctivae  and EOM are normal. Right eye exhibits no discharge. Left eye exhibits no discharge. No scleral icterus.  Neck: Normal range of motion. Carotid bruit is not present. No erythema present. No thyromegaly present.  Cardiovascular: Normal rate, regular rhythm, normal heart sounds and normal pulses.  Pulmonary/Chest:  Effort normal. No respiratory distress. She has no wheezes. Right breast exhibits no mass, no nipple discharge, no skin change and no tenderness. Left breast exhibits no mass, no nipple discharge, no skin change and no tenderness.  Abdominal: Soft. Bowel sounds are normal. There is no hepatosplenomegaly. There is no tenderness. There is no CVA tenderness.  Musculoskeletal: She exhibits tenderness. She exhibits no edema.  Lymphadenopathy:    She has no cervical adenopathy.    She has no axillary adenopathy.  Neurological: She is alert and oriented to person, place, and time. She has normal reflexes. No cranial nerve deficit or sensory deficit.  Skin: Skin is warm, dry and intact. No rash noted.  Psychiatric: She has a normal mood and affect. Her speech is normal and behavior is normal. Thought content normal.  Nursing note and vitals reviewed.   BP 122/64 (BP Location: Right Arm, Patient Position: Sitting, Cuff Size: Normal)   Pulse 60   Ht 5\' 1"  (1.549 m)   Wt 123 lb (55.8 kg)   LMP 03/12/2018 (Exact Date)   SpO2 100%   BMI 23.24 kg/m   Assessment and Plan: 1. Annual physical exam Mammogram and pap next year Colonoscopy next year - CBC with Differential/Platelet - Comprehensive metabolic panel - Lipid panel - TSH - POCT urinalysis dipstick  2. Encounter for screening mammogram for breast cancer Pt declines this year Recommend self exam monthly  3. Pain of both hip joints Trial of gabapentin - gabapentin (NEURONTIN) 100 MG capsule; Take 1 capsule (100 mg total) by mouth at bedtime.  Dispense: 30 capsule; Refill: 2  4. Vertigo, intermittent If worseing, will refer to ENT   Partially dictated using Dragon software. Any errors are unintentional.  Halina Maidens, MD [facility]  03/15/2018

## 2018-03-16 LAB — COMPREHENSIVE METABOLIC PANEL
A/G RATIO: 1.4 (ref 1.2–2.2)
ALBUMIN: 4.3 g/dL (ref 3.5–5.5)
ALT: 12 IU/L (ref 0–32)
AST: 17 IU/L (ref 0–40)
Alkaline Phosphatase: 50 IU/L (ref 39–117)
BUN / CREAT RATIO: 16 (ref 9–23)
BUN: 9 mg/dL (ref 6–24)
Bilirubin Total: 0.2 mg/dL (ref 0.0–1.2)
CALCIUM: 8.7 mg/dL (ref 8.7–10.2)
CO2: 24 mmol/L (ref 20–29)
CREATININE: 0.57 mg/dL (ref 0.57–1.00)
Chloride: 100 mmol/L (ref 96–106)
GFR, EST AFRICAN AMERICAN: 126 mL/min/{1.73_m2} (ref >59)
GFR, EST NON AFRICAN AMERICAN: 109 mL/min/{1.73_m2} (ref >59)
GLOBULIN, TOTAL: 3 g/dL (ref 1.5–4.5)
Glucose: 78 mg/dL (ref 65–99)
POTASSIUM: 4 mmol/L (ref 3.5–5.2)
SODIUM: 138 mmol/L (ref 134–144)
TOTAL PROTEIN: 7.3 g/dL (ref 6.0–8.5)

## 2018-03-16 LAB — LIPID PANEL
CHOL/HDL RATIO: 5.3 ratio — AB (ref 0.0–4.4)
Cholesterol, Total: 240 mg/dL — ABNORMAL HIGH (ref 100–199)
HDL: 45 mg/dL (ref >39)
LDL CALC: 165 mg/dL — AB (ref 0–99)
Triglycerides: 149 mg/dL (ref 0–149)
VLDL Cholesterol Cal: 30 mg/dL (ref 5–40)

## 2018-03-16 LAB — CBC WITH DIFFERENTIAL/PLATELET
BASOS ABS: 0 10*3/uL (ref 0.0–0.2)
Basos: 1 %
EOS (ABSOLUTE): 0.1 10*3/uL (ref 0.0–0.4)
Eos: 2 %
Hematocrit: 35.8 % (ref 34.0–46.6)
Hemoglobin: 11.3 g/dL (ref 11.1–15.9)
IMMATURE GRANS (ABS): 0 10*3/uL (ref 0.0–0.1)
Immature Granulocytes: 0 %
LYMPHS ABS: 2.3 10*3/uL (ref 0.7–3.1)
LYMPHS: 41 %
MCH: 27.5 pg (ref 26.6–33.0)
MCHC: 31.6 g/dL (ref 31.5–35.7)
MCV: 87 fL (ref 79–97)
Monocytes Absolute: 0.4 10*3/uL (ref 0.1–0.9)
Monocytes: 7 %
NEUTROS ABS: 2.8 10*3/uL (ref 1.4–7.0)
Neutrophils: 49 %
Platelets: 287 10*3/uL (ref 150–450)
RBC: 4.11 x10E6/uL (ref 3.77–5.28)
RDW: 13.2 % (ref 12.3–15.4)
WBC: 5.7 10*3/uL (ref 3.4–10.8)

## 2018-03-16 LAB — TSH: TSH: 2.52 u[IU]/mL (ref 0.450–4.500)

## 2018-07-29 ENCOUNTER — Other Ambulatory Visit: Payer: Self-pay

## 2018-07-29 ENCOUNTER — Encounter: Payer: Self-pay | Admitting: Internal Medicine

## 2018-07-29 ENCOUNTER — Ambulatory Visit (INDEPENDENT_AMBULATORY_CARE_PROVIDER_SITE_OTHER): Admitting: Internal Medicine

## 2018-07-29 VITALS — BP 114/78 | HR 98 | Temp 98.0°F | Ht 61.0 in | Wt 123.0 lb

## 2018-07-29 DIAGNOSIS — L03116 Cellulitis of left lower limb: Secondary | ICD-10-CM

## 2018-07-29 DIAGNOSIS — T7840XA Allergy, unspecified, initial encounter: Secondary | ICD-10-CM | POA: Diagnosis not present

## 2018-07-29 MED ORDER — CEPHALEXIN 500 MG PO CAPS: 500.0000 mg | ORAL_CAPSULE | Freq: Four times a day (QID) | ORAL | 0 refills | Status: AC

## 2018-07-29 MED ORDER — PREDNISONE 10 MG PO TABS: ORAL_TABLET | ORAL | 0 refills | Status: AC

## 2018-07-29 NOTE — Patient Instructions (Addendum)
Allegra 180 mg - take one a day  Benadryl 25 mg - take up to every 4 hours for itching (may cause drowsiness)

## 2018-07-29 NOTE — Progress Notes (Signed)
Date:  07/29/2018   Name:  Renee Gomez   DOB:  Mar 28, 1971   MRN:  [id number]   Chief Complaint: Insect Bite (Noticed last night. Itching, swollen, and constant scratching. Getting bigger. )  Rash  This is a new problem. The current episode started yesterday. The problem has been rapidly worsening since onset. The rash is diffuse. The rash is characterized by itchiness and redness. Associated with: prepared an asian soup mix yesterday that she never tried before. Pertinent negatives include no cough, diarrhea, facial edema, fatigue, fever, joint pain, shortness of breath, sore throat or vomiting. Past treatments include moisturizer. The treatment provided no relief.   She also has swelling and redness of the top of her left foot.  It was very pruritic so she has been scratching it vigorously.    Review of Systems  Constitutional: Negative for chills, fatigue and fever.  HENT: Negative for mouth sores, sore throat and trouble swallowing.   Eyes: Negative for visual disturbance.  Respiratory: Negative for cough, chest tightness and shortness of breath.   Cardiovascular: Positive for leg swelling. Negative for chest pain and palpitations.  Gastrointestinal: Negative for diarrhea and vomiting.  Musculoskeletal: Negative for joint pain.  Skin: Positive for color change and rash.  Allergic/Immunologic: Negative for environmental allergies and food allergies.  Neurological: Negative for dizziness, light-headedness and headaches.  Psychiatric/Behavioral: Negative for sleep disturbance.    Patient Active Problem List   Diagnosis Date Noted  . Vertigo, intermittent 03/15/2018  . Low back pain with sciatica 01/18/2015  . Fibrocystic breast 01/18/2015  . Acid reflux 01/18/2015  . Arthralgia of hip 01/18/2015  . Hyperlipidemia, mild 01/18/2015  . Knee pain, right 01/18/2015  . Tendinitis of elbow or forearm 01/18/2015    No Known Allergies  Past Surgical History:  Procedure  Laterality Date  . APPENDECTOMY    . BREAST EXCISIONAL BIOPSY Bilateral 25+yrs ago   neg  . LITHOTRIPSY  1995  . MINOR BREAST BIOPSY Bilateral    neg x 2  . TOTAL HIP ARTHROPLASTY Right 12/2012    Social History   Tobacco Use  . Smoking status: Never Smoker  . Smokeless tobacco: Never Used  Substance Use Topics  . Alcohol use: No    Alcohol/week: 0.0 standard drinks  . Drug use: No     Medication list has been reviewed and updated.  Current Meds  Medication Sig  . gabapentin (NEURONTIN) 100 MG capsule Take 1 capsule (100 mg total) by mouth at bedtime.    PHQ 2/9 Scores 07/29/2018 03/15/2018 06/10/2017 03/10/2017  PHQ - 2 Score 0 3 0 2  PHQ- 9 Score - 5 0 3    Physical Exam Vitals signs and nursing note reviewed.  Constitutional:      General: She is not in acute distress.    Appearance: She is well-developed.  HENT:     Head: Normocephalic and atraumatic.     Mouth/Throat:     Mouth: Mucous membranes are moist.  Eyes:     Pupils: Pupils are equal, round, and reactive to light.  Neck:     Musculoskeletal: Normal range of motion and neck supple.  Cardiovascular:     Rate and Rhythm: Normal rate and regular rhythm.     Pulses: Normal pulses.  Pulmonary:     Effort: Pulmonary effort is normal. No respiratory distress.     Breath sounds: Normal breath sounds. No wheezing.  Musculoskeletal: Normal range of motion.     Left  lower leg: Edema (of the top of the foot and toes) present.  Lymphadenopathy:     Cervical: No cervical adenopathy.  Skin:    General: Skin is warm and dry.     Findings: No rash.          Comments: Diffuse fine macular erythematous rash over entire body from the neck down.  Neurological:     Mental Status: She is alert and oriented to person, place, and time.  Psychiatric:        Behavior: Behavior normal.        Thought Content: Thought content normal.     BP 114/78   Pulse 98   Temp 98 F (36.7 C) (Oral)   Ht 5\' 1"  (1.549 m)   Wt  123 lb (55.8 kg)   SpO2 98%   BMI 23.24 kg/m   Assessment and Plan: 1. Allergic rash present on examination Likely from recent food ingestion Take Allegra daily and Benadryl every 4 hours as needed for itching To to ED if worsening sx - SOB, facial swelling, dysphagia, etc - predniSONE (DELTASONE) 10 MG tablet; Take 6 on day 1and 2, 5 on day 3 and 4, 4 on day 5 and 6 , 3 on day 7 and 8, 2 on day 9 and 10 and 1 on day 11 and 12 then stop.  Dispense: 42 tablet; Refill: 0  2. Cellulitis of left lower extremity - cephALEXin (KEFLEX) 500 MG capsule; Take 1 capsule (500 mg total) by mouth 4 (four) times daily for 10 days.  Dispense: 40 capsule; Refill: 0   Partially dictated using Editor, commissioning. Any errors are unintentional.  Halina Maidens, MD [facility]  07/29/2018

## 2019-03-17 ENCOUNTER — Encounter: Admitting: Internal Medicine

## 2019-03-28 ENCOUNTER — Encounter: Admitting: Internal Medicine

## 2019-03-29 ENCOUNTER — Other Ambulatory Visit (HOSPITAL_COMMUNITY)
Admission: RE | Admit: 2019-03-29 | Discharge: 2019-03-29 | Disposition: A | Discharge status: Home / Self Care | Source: Ambulatory Visit | Attending: Internal Medicine | Admitting: Internal Medicine

## 2019-03-29 ENCOUNTER — Ambulatory Visit (INDEPENDENT_AMBULATORY_CARE_PROVIDER_SITE_OTHER): Admitting: Internal Medicine

## 2019-03-29 ENCOUNTER — Encounter: Payer: Self-pay | Admitting: Internal Medicine

## 2019-03-29 ENCOUNTER — Other Ambulatory Visit: Payer: Self-pay

## 2019-03-29 VITALS — BP 124/78 | HR 78 | Ht 61.0 in | Wt 119.0 lb

## 2019-03-29 DIAGNOSIS — Z124 Encounter for screening for malignant neoplasm of cervix: Secondary | ICD-10-CM

## 2019-03-29 DIAGNOSIS — Z1211 Encounter for screening for malignant neoplasm of colon: Secondary | ICD-10-CM

## 2019-03-29 DIAGNOSIS — Z1231 Encounter for screening mammogram for malignant neoplasm of breast: Secondary | ICD-10-CM

## 2019-03-29 DIAGNOSIS — D485 Neoplasm of uncertain behavior of skin: Secondary | ICD-10-CM | POA: Insufficient documentation

## 2019-03-29 DIAGNOSIS — Z0000 Encounter for general adult medical examination without abnormal findings: Secondary | ICD-10-CM

## 2019-03-29 LAB — POCT URINALYSIS DIPSTICK
Bilirubin, UA: NEGATIVE
Blood, UA: NEGATIVE
Glucose, UA: NEGATIVE
Ketones, UA: NEGATIVE
Leukocytes, UA: NEGATIVE
Nitrite, UA: NEGATIVE
Protein, UA: NEGATIVE
Spec Grav, UA: <=1.005 — AB (ref 1.010–1.025)
Urobilinogen, UA: 0.2 E.U./dL
pH, UA: 6 (ref 5.0–8.0)

## 2019-03-29 NOTE — Progress Notes (Signed)
Date:  03/29/2019   Name:  Renee Gomez   DOB:  Apr 28, 1971   MRN:  [id number]   Chief Complaint: Annual Exam (Breast and papsmear.) Renee Gomez is a 48 y.o. female who presents today for her Complete Annual Exam. She feels fairly well. She reports exercising less since Covid-19. She reports she is sleeping fairly well. She denies breast issues.  She recently had influenza vaccine.  She is still having periods - some lighter than others and some mild hot flashes.  Mammogram  02/2017 Pap  2017 - due Colonoscopy - none Tdap 2017  HPI  Review of Systems  Constitutional: Negative for chills, fatigue and fever.  HENT: Negative for congestion, hearing loss, tinnitus, trouble swallowing and voice change.   Eyes: Negative for visual disturbance.  Respiratory: Negative for cough, chest tightness, shortness of breath and wheezing.   Cardiovascular: Negative for chest pain, palpitations and leg swelling.  Gastrointestinal: Negative for abdominal pain, constipation, diarrhea and vomiting.  Endocrine: Negative for polydipsia and polyuria.  Genitourinary: Positive for menstrual problem (still having menses but sometimes lighter and sometimes with clots). Negative for dysuria, frequency, genital sores, vaginal bleeding and vaginal discharge.  Musculoskeletal: Positive for arthralgias and back pain. Negative for gait problem and joint swelling.  Skin: Negative for color change and rash.  Neurological: Negative for dizziness, tremors, light-headedness and headaches.  Hematological: Negative for adenopathy. Does not bruise/bleed easily.  Psychiatric/Behavioral: Negative for dysphoric mood and sleep disturbance. The patient is not nervous/anxious.     Patient Active Problem List   Diagnosis Date Noted  . Vertigo, intermittent 03/15/2018  . Low back pain with sciatica 01/18/2015  . Fibrocystic breast 01/18/2015  . Acid reflux 01/18/2015  . Arthralgia of hip 01/18/2015  . Hyperlipidemia,  mild 01/18/2015  . Knee pain, right 01/18/2015  . Tendinitis of elbow or forearm 01/18/2015    No Known Allergies  Past Surgical History:  Procedure Laterality Date  . APPENDECTOMY    . BREAST EXCISIONAL BIOPSY Bilateral 25+yrs ago   neg  . LITHOTRIPSY  1995  . MINOR BREAST BIOPSY Bilateral    neg x 2  . TOTAL HIP ARTHROPLASTY Right 12/2012    Social History   Tobacco Use  . Smoking status: Never Smoker  . Smokeless tobacco: Never Used  Substance Use Topics  . Alcohol use: No    Alcohol/week: 0.0 standard drinks  . Drug use: No     Medication list has been reviewed and updated.  No outpatient medications have been marked as taking for the 03/29/19 encounter (Office Visit) with Glean Hess, MD.    [facility] 2/9 Scores 03/29/2019 07/29/2018 03/15/2018 06/10/2017  PHQ - 2 Score 0 0 3 0  PHQ- 9 Score 0 - 5 0    BP Readings from Last 3 Encounters:  03/29/19 124/78  07/29/18 114/78  03/15/18 122/64    Physical Exam Vitals signs and nursing note reviewed.  Constitutional:      General: She is not in acute distress.    Appearance: She is well-developed.  HENT:     Head: Normocephalic and atraumatic.     Right Ear: Tympanic membrane and ear canal normal.     Left Ear: Tympanic membrane and ear canal normal.     Nose:     Right Sinus: No maxillary sinus tenderness.     Left Sinus: No maxillary sinus tenderness.  Eyes:     General: No scleral icterus.  Right eye: No discharge.        Left eye: No discharge.     Conjunctiva/sclera: Conjunctivae normal.  Neck:     Musculoskeletal: Normal range of motion. No erythema.     Thyroid: No thyromegaly.     Vascular: No carotid bruit.  Cardiovascular:     Rate and Rhythm: Normal rate and regular rhythm.     Pulses: Normal pulses.     Heart sounds: Normal heart sounds.  Pulmonary:     Effort: Pulmonary effort is normal. No respiratory distress.     Breath sounds: No wheezing.  Chest:     Breasts:        Right: No  mass, nipple discharge, skin change or tenderness.        Left: No mass, nipple discharge, skin change or tenderness.  Abdominal:     General: Bowel sounds are normal.     Palpations: Abdomen is soft.     Tenderness: There is no abdominal tenderness.  Genitourinary:    Labia:        Right: No rash, tenderness or lesion.        Left: No rash, tenderness or lesion.      Vagina: Normal.     Cervix: Normal.     Uterus: Normal.      Adnexa: Right adnexa normal and left adnexa normal.     Musculoskeletal: Normal range of motion.  Lymphadenopathy:     Cervical: No cervical adenopathy.  Skin:    General: Skin is warm and dry.     Findings: No rash.     Comments: Several facial moles and hypopigmented areas that are new  Neurological:     Mental Status: She is alert and oriented to person, place, and time.     Cranial Nerves: No cranial nerve deficit.     Sensory: No sensory deficit.     Deep Tendon Reflexes: Reflexes are normal and symmetric.  Psychiatric:        Attention and Perception: Attention normal.        Mood and Affect: Mood normal.        Speech: Speech normal.        Behavior: Behavior normal.        Thought Content: Thought content normal.     Wt Readings from Last 3 Encounters:  03/29/19 119 lb (54 kg)  07/29/18 123 lb (55.8 kg)  03/15/18 123 lb (55.8 kg)    BP 124/78   Pulse 78   Ht 5\' 1"  (1.549 m)   Wt 119 lb (54 kg)   LMP 03/15/2019 (Approximate)   SpO2 98%   BMI 22.48 kg/m   Assessment and Plan: 1. Annual physical exam Normal exam Continue healthy diet, resume exercise when able - CBC with Differential/Platelet - Comprehensive metabolic panel - Lipid panel - POCT urinalysis dipstick - TSH  2. Encounter for screening for cervical cancer  Benign appearing endocervical polyp noted - Cytology - PAP  3. Encounter for screening mammogram for breast cancer Pt to schedule at [place]; Future  4. Colon cancer  screening - Ambulatory referral to Gastroenterology  5. Neoplasm of uncertain behavior of skin - Ambulatory referral to Dermatology   Partially dictated using Dragon software. Any errors are unintentional.  Halina Maidens, MD [facility]  03/29/2019

## 2019-03-30 LAB — CBC WITH DIFFERENTIAL/PLATELET
Basophils Absolute: 0 10*3/uL (ref 0.0–0.2)
Basos: 1 %
EOS (ABSOLUTE): 0.1 10*3/uL (ref 0.0–0.4)
Eos: 1 %
Hematocrit: 34.4 % (ref 34.0–46.6)
Hemoglobin: 10.7 g/dL — ABNORMAL LOW (ref 11.1–15.9)
Immature Grans (Abs): 0 10*3/uL (ref 0.0–0.1)
Immature Granulocytes: 0 %
Lymphocytes Absolute: 2.1 10*3/uL (ref 0.7–3.1)
Lymphs: 33 %
MCH: 27.1 pg (ref 26.6–33.0)
MCHC: 31.1 g/dL — ABNORMAL LOW (ref 31.5–35.7)
MCV: 87 fL (ref 79–97)
Monocytes Absolute: 0.5 10*3/uL (ref 0.1–0.9)
Monocytes: 8 %
Neutrophils Absolute: 3.7 10*3/uL (ref 1.4–7.0)
Neutrophils: 57 %
Platelets: 286 10*3/uL (ref 150–450)
RBC: 3.95 x10E6/uL (ref 3.77–5.28)
RDW: 13.5 % (ref 11.7–15.4)
WBC: 6.4 10*3/uL (ref 3.4–10.8)

## 2019-03-30 LAB — COMPREHENSIVE METABOLIC PANEL
ALT: 12 IU/L (ref 0–32)
AST: 18 IU/L (ref 0–40)
Albumin/Globulin Ratio: 1.6 (ref 1.2–2.2)
Albumin: 4.2 g/dL (ref 3.8–4.8)
Alkaline Phosphatase: 52 IU/L (ref 39–117)
BUN/Creatinine Ratio: 16 (ref 9–23)
BUN: 9 mg/dL (ref 6–24)
Bilirubin Total: 0.3 mg/dL (ref 0.0–1.2)
CO2: 25 mmol/L (ref 20–29)
Calcium: 9.1 mg/dL (ref 8.7–10.2)
Chloride: 103 mmol/L (ref 96–106)
Creatinine, Ser: 0.57 mg/dL (ref 0.57–1.00)
GFR calc Af Amer: 125 mL/min/{1.73_m2} (ref >59)
GFR calc non Af Amer: 108 mL/min/{1.73_m2} (ref >59)
Globulin, Total: 2.7 g/dL (ref 1.5–4.5)
Glucose: 80 mg/dL (ref 65–99)
Potassium: 3.8 mmol/L (ref 3.5–5.2)
Sodium: 138 mmol/L (ref 134–144)
Total Protein: 6.9 g/dL (ref 6.0–8.5)

## 2019-03-30 LAB — CYTOLOGY - PAP
Adequacy: ABSENT
Comment: NEGATIVE
Diagnosis: NEGATIVE
High risk HPV: NEGATIVE

## 2019-03-30 LAB — LIPID PANEL
Chol/HDL Ratio: 4.6 ratio — ABNORMAL HIGH (ref 0.0–4.4)
Cholesterol, Total: 246 mg/dL — ABNORMAL HIGH (ref 100–199)
HDL: 54 mg/dL (ref >39)
LDL Chol Calc (NIH): 172 mg/dL — ABNORMAL HIGH (ref 0–99)
Triglycerides: 110 mg/dL (ref 0–149)
VLDL Cholesterol Cal: 20 mg/dL (ref 5–40)

## 2019-03-30 LAB — TSH: TSH: 2.67 u[IU]/mL (ref 0.450–4.500)

## 2019-04-03 ENCOUNTER — Telehealth: Payer: Self-pay | Admitting: Gastroenterology

## 2019-04-03 ENCOUNTER — Other Ambulatory Visit: Payer: Self-pay

## 2019-04-03 DIAGNOSIS — Z1211 Encounter for screening for malignant neoplasm of colon: Secondary | ICD-10-CM

## 2019-04-03 NOTE — Telephone Encounter (Signed)
Gastroenterology Pre-Procedure Review  Request Date: 04/20/19 Requesting Physician: Dr. Marius Ditch  PATIENT REVIEW QUESTIONS: The patient responded to the following health history questions as indicated:    1. Are you having any GI issues? no 2. Do you have a personal history of Polyps? no 3. Do you have a family history of Colon Cancer or Polyps? no 4. Diabetes Mellitus? no 5. Joint replacements in the past 12 months?5 years ago pt had hip replacement, and favors one side over he other 6. Major health problems in the past 3 months?no 7. Any artificial heart valves, MVP, or defibrillator?no    MEDICATIONS & ALLERGIES:    Patient reports the following regarding taking any anticoagulation/antiplatelet therapy:   Plavix, Coumadin, Eliquis, Xarelto, Lovenox, Pradaxa, Brilinta, or Effient? no Aspirin? no  Patient confirms/reports the following medications:  No current outpatient medications on file.   No current facility-administered medications for this visit.     Patient confirms/reports the following allergies:  No Known Allergies  No orders of the defined types were placed in this encounter.   AUTHORIZATION INFORMATION Primary Insurance: 1D#: Group #:  Secondary Insurance: 1D#: Group #:  SCHEDULE INFORMATION: Date: 04/24/19 Time: Location:[facility]

## 2019-04-03 NOTE — Telephone Encounter (Signed)
Pt husband left vm to schedule pt for a colonsocpy

## 2019-04-20 ENCOUNTER — Other Ambulatory Visit
Admission: RE | Admit: 2019-04-20 | Discharge: 2019-04-20 | Disposition: A | Discharge status: Home / Self Care | Source: Ambulatory Visit | Attending: Gastroenterology | Admitting: Gastroenterology

## 2019-04-20 ENCOUNTER — Other Ambulatory Visit: Payer: Self-pay

## 2019-04-20 DIAGNOSIS — Z20828 Contact with and (suspected) exposure to other viral communicable diseases: Secondary | ICD-10-CM | POA: Diagnosis not present

## 2019-04-20 DIAGNOSIS — Z01812 Encounter for preprocedural laboratory examination: Secondary | ICD-10-CM | POA: Insufficient documentation

## 2019-04-20 LAB — SARS CORONAVIRUS 2 (TAT 6-24 HRS): SARS Coronavirus 2: NEGATIVE

## 2019-04-21 ENCOUNTER — Encounter: Payer: Self-pay | Admitting: *Deleted

## 2019-04-24 ENCOUNTER — Other Ambulatory Visit: Payer: Self-pay

## 2019-04-24 ENCOUNTER — Ambulatory Visit
Admission: RE | Admit: 2019-04-24 | Discharge: 2019-04-24 | Disposition: A | Discharge status: Home / Self Care | Attending: Gastroenterology | Admitting: Gastroenterology

## 2019-04-24 ENCOUNTER — Encounter: Payer: Self-pay | Admitting: *Deleted

## 2019-04-24 ENCOUNTER — Ambulatory Visit: Admitting: Anesthesiology

## 2019-04-24 ENCOUNTER — Encounter
Admission: RE | Disposition: A | Discharge status: Home / Self Care | Payer: Self-pay | Source: Home / Self Care | Attending: Gastroenterology

## 2019-04-24 DIAGNOSIS — Z1211 Encounter for screening for malignant neoplasm of colon: Secondary | ICD-10-CM

## 2019-04-24 DIAGNOSIS — Z96641 Presence of right artificial hip joint: Secondary | ICD-10-CM | POA: Insufficient documentation

## 2019-04-24 HISTORY — DX: Personal history of urinary calculi: Z87.442

## 2019-04-24 HISTORY — PX: COLONOSCOPY WITH PROPOFOL: SHX5780

## 2019-04-24 LAB — POCT PREGNANCY, URINE: Preg Test, Ur: NEGATIVE

## 2019-04-24 SURGERY — COLONOSCOPY WITH PROPOFOL
Anesthesia: General

## 2019-04-24 MED ORDER — LIDOCAINE HCL (CARDIAC) PF 100 MG/5ML IV SOSY
PREFILLED_SYRINGE | INTRAVENOUS | Status: DC | PRN
  Administered 2019-04-24: 50 mg via INTRAVENOUS

## 2019-04-24 MED ORDER — PROPOFOL 10 MG/ML IV BOLUS
INTRAVENOUS | Status: DC | PRN
  Administered 2019-04-24: 60 mg via INTRAVENOUS
  Administered 2019-04-24: 20 mg via INTRAVENOUS

## 2019-04-24 MED ORDER — SODIUM CHLORIDE 0.9 % IV SOLN
INTRAVENOUS | Status: DC
  Administered 2019-04-24: 11:00:00 1000 mL via INTRAVENOUS

## 2019-04-24 MED ORDER — PROPOFOL 500 MG/50ML IV EMUL
INTRAVENOUS | Status: DC | PRN
  Administered 2019-04-24: 175 ug/kg/min via INTRAVENOUS

## 2019-04-24 NOTE — H&P (Signed)
Renee Darby, MD [address]  [place]  [place], [place] [address]  Main: [phone]  Fax: [phone] Pager: [phone]  Primary Care Physician:  Glean Hess, MD Primary Gastroenterologist:  Dr. Cephas Gomez  Pre-Procedure History & Physical: HPI:  Renee Gomez is a 48 y.o. female is here for an colonoscopy.   Past Medical History:  Diagnosis Date  . History of kidney stones   . Moderate episode of recurrent major depressive disorder ([place]) 03/10/2017    Past Surgical History:  Procedure Laterality Date  . APPENDECTOMY    . BREAST EXCISIONAL BIOPSY Bilateral 25+yrs ago   neg  . BREAST SURGERY    . JOINT REPLACEMENT    . LITHOTRIPSY  1995  . MINOR BREAST BIOPSY Bilateral    neg x 2  . TOTAL HIP ARTHROPLASTY Right 12/2012    Prior to Admission medications   Not on File    Allergies as of 04/03/2019  . (No Known Allergies)    Family History  Problem Relation Age of Onset  . Cancer Mother        ovarian    Social History   Socioeconomic History  . Marital status: Married    Spouse name: Not on file  . Number of children: Not on file  . Years of education: Not on file  . Highest education level: Not on file  Occupational History  . Not on file  Social Needs  . Financial resource strain: Not on file  . Food insecurity    Worry: Not on file    Inability: Not on file  . Transportation needs    Medical: Not on file    Non-medical: Not on file  Tobacco Use  . Smoking status: Never Smoker  . Smokeless tobacco: Never Used  Substance and Sexual Activity  . Alcohol use: No    Alcohol/week: 0.0 standard drinks  . Drug use: No  . Sexual activity: Not on file  Lifestyle  . Physical activity    Days per week: Not on file    Minutes per session: Not on file  . Stress: Not on file  Relationships  . Social Herbalist on phone: Not on file    Gets together: Not on file    Attends religious service: Not on file    Active  member of club or organization: Not on file    Attends meetings of clubs or organizations: Not on file    Relationship status: Not on file  . Intimate partner violence    Fear of current or ex partner: Not on file    Emotionally abused: Not on file    Physically abused: Not on file    Forced sexual activity: Not on file  Other Topics Concern  . Not on file  Social History Narrative  . Not on file    Review of Systems: See HPI, otherwise negative ROS  Physical Exam: BP 114/76   Pulse 84   Temp 98.3 F (36.8 C) (Tympanic)   Resp 16   Ht 5\' 1"  (1.549 m)   Wt 55.3 kg   LMP 04/02/2019   SpO2 100%   BMI 23.05 kg/m  General:   Alert,  pleasant and cooperative in NAD Head:  Normocephalic and atraumatic. Neck:  Supple; no masses or thyromegaly. Lungs:  Clear throughout to auscultation.    Heart:  Regular rate and rhythm. Abdomen:  Soft, nontender and nondistended. Normal bowel sounds, without guarding,  and without rebound.   Neurologic:  Alert and  oriented x4;  grossly normal neurologically.  Impression/Plan: [name] is here for an colonoscopy to be performed for colon cancer screening  Risks, benefits, limitations, and alternatives regarding  colonoscopy have been reviewed with the patient.  Questions have been answered.  All parties agreeable.   Sherri Sear, MD  04/24/2019, 11:11 AM

## 2019-04-24 NOTE — Anesthesia Procedure Notes (Signed)
Date/Time: 04/24/2019 11:34 AM Performed by: Johnna Acosta, CRNA Pre-anesthesia Checklist: Patient identified, Emergency Drugs available, Patient being monitored, Suction available and Timeout performed Patient Re-evaluated:Patient Re-evaluated prior to induction Oxygen Delivery Method: Nasal cannula Preoxygenation: Pre-oxygenation with 100% oxygen Induction Type: IV induction

## 2019-04-24 NOTE — Transfer of Care (Signed)
Immediate Anesthesia Transfer of Care Note  Patient: [facility]  Procedure(s) Performed: COLONOSCOPY WITH PROPOFOL (N/A )  Patient Location: PACU  Anesthesia Type:General  Level of Consciousness: awake and alert   Airway & Oxygen Therapy: Patient Spontanous Breathing  Post-op Assessment: Report given to RN and Post -op Vital signs reviewed and stable    Post vital signs: Reviewed and stable  Last Vitals:  Vitals Value Taken Time  BP 96/62 04/24/19 1154  Temp 36.5 C 04/24/19 1154  Pulse 81 04/24/19 1156  Resp 16 04/24/19 1156  SpO2 100 % 04/24/19 1156  Vitals shown include unvalidated device data.  Last Pain:  Vitals:   04/24/19 1154  TempSrc: Temporal  PainSc:          Complications: No apparent anesthesia complications

## 2019-04-24 NOTE — Op Note (Signed)
[facility] Patient Name: Renee Gomez Procedure Date: 04/24/2019 11:30 AM MRN: [id number] Account #: [id number] Date of Birth: [birth year] Admit Type: Outpatient Age: 48 Room: [facility] ENDO ROOM 2 Gender: Female Note Status: Finalized Procedure:             Colonoscopy Indications:           Screening for colorectal malignant neoplasm, This is                         the patient's first colonoscopy Providers:             Lin Landsman MD, MD Referring MD:          Halina Maidens, MD (Referring MD) Medicines:             Monitored Anesthesia Care Complications:         No immediate complications. Estimated blood loss: None. Procedure:             Pre-Anesthesia Assessment:                        - Prior to the procedure, a History and Physical was                         performed, and patient medications and allergies were                         reviewed. The patient is competent. The risks and                         benefits of the procedure and the sedation options and                         risks were discussed with the patient. All questions                         were answered and informed consent was obtained.                         Patient identification and proposed procedure were                         verified by the physician, the nurse, the                         anesthesiologist, the anesthetist and the technician                         in the pre-procedure area in the procedure room in the                         endoscopy suite. Mental Status Examination: alert and                         oriented. Airway Examination: normal oropharyngeal                         airway and neck mobility. Respiratory Examination:  clear to auscultation. CV Examination: normal.                         Prophylactic Antibiotics: The patient does not require                         prophylactic antibiotics. Prior  Anticoagulants: The                         patient has taken no previous anticoagulant or                         antiplatelet agents. ASA Grade Assessment: II - A                         patient with mild systemic disease. After reviewing                         the risks and benefits, the patient was deemed in                         satisfactory condition to undergo the procedure. The                         anesthesia plan was to use monitored anesthesia care                         (MAC). Immediately prior to administration of                         medications, the patient was re-assessed for adequacy                         to receive sedatives. The heart rate, respiratory                         rate, oxygen saturations, blood pressure, adequacy of                         pulmonary ventilation, and response to care were                         monitored throughout the procedure. The physical                         status of the patient was re-assessed after the                         procedure.                        After obtaining informed consent, the colonoscope was                         passed under direct vision. Throughout the procedure,                         the patient's blood pressure, pulse, and oxygen  saturations were monitored continuously. The                         Colonoscope was introduced through the anus and                         advanced to the the terminal ileum, with                         identification of the appendiceal orifice and IC                         valve. The colonoscopy was performed without                         difficulty. The patient tolerated the procedure well.                         The quality of the bowel preparation was evaluated                         using the BBPS [facility] Bowel Preparation Scale) with                         scores of: Right Colon = 3, Transverse Colon = 3 and                          Left Colon = 3 (entire mucosa seen well with no                         residual staining, small fragments of stool or opaque                         liquid). The total BBPS score equals 9. Findings:      The perianal and digital rectal examinations were normal. Pertinent       negatives include normal sphincter tone and no palpable rectal lesions.      The terminal ileum appeared normal.      The entire examined colon appeared normal.      The retroflexed view of the distal rectum and anal verge was normal and       showed no anal or rectal abnormalities. Impression:            - The examined portion of the ileum was normal.                        - The entire examined colon is normal.                        - The distal rectum and anal verge are normal on                         retroflexion view.                        - No specimens collected. Recommendation:        - Discharge patient to home (with escort).                        -  Resume regular diet today.                        - Repeat colonoscopy in 10 years for surveillance. Procedure Code(s):     --- Professional ---                        Z6109, Colorectal cancer screening; colonoscopy on                         individual not meeting criteria for high risk Diagnosis Code(s):     --- Professional ---                        Z12.11, Encounter for screening for malignant neoplasm                         of colon CPT copyright 2019 American Medical Association. All rights reserved. The codes documented in this report are preliminary and upon coder review may  be revised to meet current compliance requirements. Dr. Ulyess Mort Lin Landsman MD, MD 04/24/2019 11:50:44 AM This report has been signed electronically. Number of Addenda: 0 Note Initiated On: 04/24/2019 11:30 AM Scope Withdrawal Time: 0 hours 6 minutes 4 seconds  Total Procedure Duration: 0 hours 9 minutes 28 seconds  Estimated Blood Loss:  Estimated  blood loss: none. Estimated blood loss: none.      [facility]

## 2019-04-24 NOTE — Anesthesia Post-op Follow-up Note (Signed)
Anesthesia QCDR form completed.        

## 2019-04-24 NOTE — Anesthesia Preprocedure Evaluation (Signed)
Anesthesia Evaluation  Patient identified by MRN, date of birth, ID band Patient awake    Reviewed: Allergy & Precautions, H&P , NPO status , Patient's Chart, lab work & pertinent test results, reviewed documented beta blocker date and time   History of Anesthesia Complications Negative for: history of anesthetic complications  Airway Mallampati: I  TM Distance: >3 FB Neck ROM: full    Dental  (+) Caps, Dental Advidsory Given, Teeth Intact   Pulmonary neg pulmonary ROS,    Pulmonary exam normal breath sounds clear to auscultation       Cardiovascular Exercise Tolerance: Good negative cardio ROS Normal cardiovascular exam Rhythm:regular Rate:Normal     Neuro/Psych PSYCHIATRIC DISORDERS Depression negative neurological ROS     GI/Hepatic negative GI ROS, Neg liver ROS,   Endo/Other  negative endocrine ROS  Renal/GU Renal disease (kidney stones)  negative genitourinary   Musculoskeletal   Abdominal   Peds  Hematology negative hematology ROS (+)   Anesthesia Other Findings Past Medical History: No date: History of kidney stones 03/10/2017: Moderate episode of recurrent major depressive disorder  (HCC)   Reproductive/Obstetrics negative OB ROS                             Anesthesia Physical Anesthesia Plan  ASA: II  Anesthesia Plan: General   Post-op Pain Management:    Induction: Intravenous  PONV Risk Score and Plan: 3 and Propofol infusion and TIVA  Airway Management Planned: Natural Airway and Nasal Cannula  Additional Equipment:   Intra-op Plan:   Post-operative Plan:   Informed Consent: I have reviewed the patients History and Physical, chart, labs and discussed the procedure including the risks, benefits and alternatives for the proposed anesthesia with the patient or authorized representative who has indicated his/her understanding and acceptance.     Dental  Advisory Given  Plan Discussed with: Anesthesiologist, CRNA and Surgeon  Anesthesia Plan Comments:         Anesthesia Quick Evaluation

## 2019-04-25 NOTE — Anesthesia Postprocedure Evaluation (Signed)
Anesthesia Post Note  Patient: [facility]  Procedure(s) Performed: COLONOSCOPY WITH PROPOFOL (N/A )  Patient location during evaluation: Endoscopy Anesthesia Type: General Level of consciousness: awake and alert Pain management: pain level controlled Vital Signs Assessment: post-procedure vital signs reviewed and stable Respiratory status: spontaneous breathing, nonlabored ventilation, respiratory function stable and patient connected to nasal cannula oxygen Cardiovascular status: blood pressure returned to baseline and stable Postop Assessment: no apparent nausea or vomiting Anesthetic complications: no     Last Vitals:  Vitals:   04/24/19 1210 04/24/19 1220  BP: 110/70 108/70  Pulse: 74   Resp: 19   Temp:    SpO2: 100% 99%    Last Pain:  Vitals:   04/24/19 1220  TempSrc:   PainSc: 0-No pain                 Martha Clan

## 2019-08-01 ENCOUNTER — Ambulatory Visit
Admission: RE | Admit: 2019-08-01 | Discharge: 2019-08-01 | Disposition: A | Discharge status: Home / Self Care | Source: Ambulatory Visit | Attending: Internal Medicine | Admitting: Internal Medicine

## 2019-08-01 ENCOUNTER — Other Ambulatory Visit: Payer: Self-pay | Admitting: Internal Medicine

## 2019-08-01 DIAGNOSIS — Z1231 Encounter for screening mammogram for malignant neoplasm of breast: Secondary | ICD-10-CM | POA: Insufficient documentation

## 2019-08-08 ENCOUNTER — Ambulatory Visit (INDEPENDENT_AMBULATORY_CARE_PROVIDER_SITE_OTHER): Admitting: Internal Medicine

## 2019-08-08 ENCOUNTER — Other Ambulatory Visit: Payer: Self-pay

## 2019-08-08 ENCOUNTER — Encounter: Payer: Self-pay | Admitting: Internal Medicine

## 2019-08-08 VITALS — BP 108/82 | HR 92 | Temp 97.5°F | Ht 61.0 in | Wt 118.0 lb

## 2019-08-08 DIAGNOSIS — H1032 Unspecified acute conjunctivitis, left eye: Secondary | ICD-10-CM

## 2019-08-08 DIAGNOSIS — H019 Unspecified inflammation of eyelid: Secondary | ICD-10-CM | POA: Diagnosis not present

## 2019-08-08 MED ORDER — NEOMYCIN-POLYMYXIN-DEXAMETH 3.5-10000-0.1 OP SUSP: 2.0000 [drp] | Freq: Four times a day (QID) | OPHTHALMIC | 0 refills | Status: AC

## 2019-08-08 MED ORDER — TRIAMCINOLONE ACETONIDE 0.1 % EX CREA: 1.0000 "application " | TOPICAL_CREAM | Freq: Two times a day (BID) | CUTANEOUS | 0 refills | Status: DC

## 2019-08-08 NOTE — Patient Instructions (Addendum)
Take Claritin or Allegra once a day to reduce itching  Take Benadryl 25 mg at bedtime for itching  Can also use a warm or cool compress as needed

## 2019-08-08 NOTE — Progress Notes (Signed)
Date:  08/08/2019   Name:  Renee Gomez   DOB:  Jun 13, 1970   MRN:  [id number]   Chief Complaint: No chief complaint on file.  Eye Problem  The left eye is affected. This is a new problem. The current episode started in the past 7 days. The problem occurs constantly. The problem has been unchanged. There was no injury mechanism. The pain is mild. There is no known exposure to pink eye. She does not wear contacts. Associated symptoms include eye redness (left eye lid, also itchy) and itching. Pertinent negatives include no fever. Treatments tried: TAO     Lab Results  Component Value Date   CREATININE 0.57 03/29/2019   BUN 9 03/29/2019   NA 138 03/29/2019   K 3.8 03/29/2019   CL 103 03/29/2019   CO2 25 03/29/2019   Lab Results  Component Value Date   CHOL 246 (H) 03/29/2019   HDL 54 03/29/2019   LDLCALC 172 (H) 03/29/2019   TRIG 110 03/29/2019   CHOLHDL 4.6 (H) 03/29/2019   Lab Results  Component Value Date   TSH 2.670 03/29/2019   No results found for: HGBA1C   Review of Systems  Constitutional: Negative for chills, fatigue and fever.  Eyes: Positive for redness (left eye lid, also itchy) and itching. Negative for visual disturbance.  Respiratory: Negative for cough and shortness of breath.   Cardiovascular: Negative for chest pain.  Neurological: Negative for dizziness and headaches.    Patient Active Problem List   Diagnosis Date Noted  . Encounter for screening colonoscopy   . 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  . Vertigo, intermittent 03/15/2018  . Low back pain with sciatica 01/18/2015  . Fibrocystic breast 01/18/2015  . Acid reflux 01/18/2015  . Arthralgia of hip 01/18/2015  . Hyperlipidemia, mild 01/18/2015  . Knee pain, right 01/18/2015  . Tendinitis of elbow or forearm 01/18/2015    No Known Allergies  Past Surgical History:  Procedure Laterality Date  . APPENDECTOMY    . BREAST EXCISIONAL BIOPSY Bilateral 25+yrs ago   neg  .  BREAST SURGERY    . COLONOSCOPY WITH PROPOFOL N/A 04/24/2019   Procedure: COLONOSCOPY WITH PROPOFOL;  Surgeon: Lin Landsman, MD;  Location: [facility] ENDOSCOPY;  Service: Gastroenterology;  Laterality: N/A;  . JOINT REPLACEMENT    . LITHOTRIPSY  1995  . MINOR BREAST BIOPSY Bilateral    neg x 2  . TOTAL HIP ARTHROPLASTY Right 12/2012    Social History   Tobacco Use  . Smoking status: Never Smoker  . Smokeless tobacco: Never Used  Substance Use Topics  . Alcohol use: No    Alcohol/week: 0.0 standard drinks  . Drug use: No     Medication list has been reviewed and updated.  No outpatient medications have been marked as taking for the 08/08/19 encounter (Appointment) with Glean Hess, MD.    [facility] 2/9 Scores 03/29/2019 07/29/2018 03/15/2018 06/10/2017  PHQ - 2 Score 0 0 3 0  PHQ- 9 Score 0 - 5 0    BP Readings from Last 3 Encounters:  04/24/19 108/70  03/29/19 124/78  07/29/18 114/78    Physical Exam Vitals and nursing note reviewed.  Constitutional:      General: She is not in acute distress.    Appearance: She is well-developed.  HENT:     Head: Normocephalic and atraumatic.  Eyes:     General:        Right eye: No  hordeolum.        Left eye: No hordeolum.     Extraocular Movements: Extraocular movements intact.     Conjunctiva/sclera: Conjunctivae normal.   Cardiovascular:     Rate and Rhythm: Normal rate and regular rhythm.  Pulmonary:     Effort: Pulmonary effort is normal. No respiratory distress.     Breath sounds: Normal breath sounds.  Musculoskeletal:        General: Normal range of motion.  Lymphadenopathy:     Cervical: No cervical adenopathy.  Skin:    General: Skin is warm and dry.     Findings: No rash.  Neurological:     Mental Status: She is alert and oriented to person, place, and time.  Psychiatric:        Behavior: Behavior normal.        Thought Content: Thought content normal.     Wt Readings from Last 3 Encounters:  04/24/19  122 lb (55.3 kg)  03/29/19 119 lb (54 kg)  07/29/18 123 lb (55.8 kg)    There were no vitals taken for this visit.  Assessment and Plan: 1. Inflammation of eyelid Local irritation noted - exacerbated by repeated trauma Use TAC and warm or cool compresses to avoid scratching Benadryl at bedtime; allegra or claritin during the day - triamcinolone cream (KENALOG) 0.1 %; Apply 1 application topically 2 (two) times daily. Small amount to upper eyelid twice a day  Dispense: 30 g; Refill: 0  2. Acute conjunctivitis of left eye, unspecified acute conjunctivitis type - neomycin-polymyxin b-dexamethasone (MAXITROL) 3.5-10000-0.1 SUSP; Place 2 drops into both eyes every 6 (six) hours for 10 days.  Dispense: 5 mL; Refill: 0   Partially dictated using Editor, commissioning. Any errors are unintentional.  Halina Maidens, MD [facility]  08/08/2019

## 2019-10-03 ENCOUNTER — Ambulatory Visit (INDEPENDENT_AMBULATORY_CARE_PROVIDER_SITE_OTHER): Admitting: Internal Medicine

## 2019-10-03 ENCOUNTER — Other Ambulatory Visit: Payer: Self-pay

## 2019-10-03 ENCOUNTER — Encounter: Payer: Self-pay | Admitting: Internal Medicine

## 2019-10-03 VITALS — BP 108/68 | HR 68 | Temp 97.7°F | Ht 61.0 in | Wt 122.0 lb

## 2019-10-03 DIAGNOSIS — B070 Plantar wart: Secondary | ICD-10-CM

## 2019-10-03 NOTE — Progress Notes (Signed)
Date:  10/03/2019   Name:  Renee Gomez   DOB:  1970-08-14   MRN:  [id number]   Chief Complaint: Foot Pain (Painful foot- Has a corn on the bottom of foot. Its been there since last year. Hard pain when walking on foot. )  HPI  Lab Results  Component Value Date   CREATININE 0.57 03/29/2019   BUN 9 03/29/2019   NA 138 03/29/2019   K 3.8 03/29/2019   CL 103 03/29/2019   CO2 25 03/29/2019   Lab Results  Component Value Date   CHOL 246 (H) 03/29/2019   HDL 54 03/29/2019   LDLCALC 172 (H) 03/29/2019   TRIG 110 03/29/2019   CHOLHDL 4.6 (H) 03/29/2019   Lab Results  Component Value Date   TSH 2.670 03/29/2019   No results found for: HGBA1C Lab Results  Component Value Date   WBC 6.4 03/29/2019   HGB 10.7 (L) 03/29/2019   HCT 34.4 03/29/2019   MCV 87 03/29/2019   PLT 286 03/29/2019   Lab Results  Component Value Date   ALT 12 03/29/2019   AST 18 03/29/2019   ALKPHOS 52 03/29/2019   BILITOT 0.3 03/29/2019     Review of Systems  Constitutional: Negative for chills and fatigue.  Respiratory: Negative for cough and shortness of breath.   Cardiovascular: Negative for chest pain.  Musculoskeletal: Positive for gait problem (right foot pain).  Neurological: Negative for dizziness and headaches.    Patient Active Problem List   Diagnosis Date Noted  . Encounter for screening colonoscopy   . 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  . Vertigo, intermittent 03/15/2018  . Low back pain with sciatica 01/18/2015  . Fibrocystic breast 01/18/2015  . Acid reflux 01/18/2015  . Arthralgia of hip 01/18/2015  . Hyperlipidemia, mild 01/18/2015  . Knee pain, right 01/18/2015  . Tendinitis of elbow or forearm 01/18/2015    No Known Allergies  Past Surgical History:  Procedure Laterality Date  . APPENDECTOMY    . BREAST EXCISIONAL BIOPSY Bilateral 25+yrs ago   neg  . BREAST SURGERY    . COLONOSCOPY WITH PROPOFOL N/A 04/24/2019   Procedure: COLONOSCOPY  WITH PROPOFOL;  Surgeon: Lin Landsman, MD;  Location: [facility] ENDOSCOPY;  Service: Gastroenterology;  Laterality: N/A;  . JOINT REPLACEMENT    . LITHOTRIPSY  1995  . MINOR BREAST BIOPSY Bilateral    neg x 2  . TOTAL HIP ARTHROPLASTY Right 12/2012    Social History   Tobacco Use  . Smoking status: Never Smoker  . Smokeless tobacco: Never Used  Substance Use Topics  . Alcohol use: No    Alcohol/week: 0.0 standard drinks  . Drug use: No     Medication list has been reviewed and updated.  No outpatient medications have been marked as taking for the 10/03/19 encounter (Office Visit) with Glean Hess, MD.    [facility] 2/9 Scores 10/03/2019 08/08/2019 03/29/2019 07/29/2018  PHQ - 2 Score 0 0 0 0  PHQ- 9 Score 0 2 0 -    BP Readings from Last 3 Encounters:  10/03/19 108/68  08/08/19 108/82  04/24/19 108/70    Physical Exam Vitals and nursing note reviewed.  Constitutional:      General: She is not in acute distress.    Appearance: She is well-developed.  HENT:     Head: Normocephalic and atraumatic.  Pulmonary:     Effort: Pulmonary effort is normal. No respiratory distress.  Musculoskeletal:  General: Normal range of motion.       Feet:  Skin:    General: Skin is warm and dry.     Findings: No rash.  Neurological:     Mental Status: She is alert and oriented to person, place, and time.  Psychiatric:        Behavior: Behavior normal.        Thought Content: Thought content normal.     Wt Readings from Last 3 Encounters:  10/03/19 122 lb (55.3 kg)  08/08/19 118 lb (53.5 kg)  04/24/19 122 lb (55.3 kg)    BP 108/68   Pulse 68   Temp 97.7 F (36.5 C) (Temporal)   Ht 5\' 1"  (1.549 m)   Wt 122 lb (55.3 kg)   LMP  (Exact Date)   SpO2 98%   BMI 23.05 kg/m   Assessment and Plan: 1. Plantar wart of right foot Needs podiatry to evaluate and treat - Ambulatory referral to Podiatry   Partially dictated using [place]. Any errors are  unintentional.  Halina Maidens, MD [facility]  10/03/2019

## 2019-12-26 ENCOUNTER — Telehealth: Payer: Self-pay | Admitting: Internal Medicine

## 2019-12-26 ENCOUNTER — Other Ambulatory Visit: Payer: Self-pay

## 2019-12-26 MED ORDER — AMOXICILLIN 500 MG PO CAPS: 2000.0000 mg | ORAL_CAPSULE | Freq: Once | ORAL | 1 refills | Status: AC

## 2019-12-26 NOTE — Telephone Encounter (Signed)
Placed RF for amoxicillin for her dental procedure.   CM

## 2019-12-26 NOTE — Telephone Encounter (Unsigned)
Copied from [place] [phone]. Topic: General - Inquiry >> Dec 26, 2019 12:53 PM Alease Frame wrote: Reason for CRM: Pt husband called in wanting to a call back from [facility] nurse . Pts is having dental work done and is needing prescription prior to dental exam. Please reach out to husband for more info

## 2019-12-26 NOTE — Progress Notes (Signed)
Sent in amoxicillin RF for patients dental procedure.   CM

## 2020-04-01 NOTE — Progress Notes (Signed)
Date:  04/02/2020   Name:  Renee Gomez   DOB:  23-Jun-1970   MRN:  [id number]   Chief Complaint: Annual Exam (breast exam no pap) and Shoulder Pain (X5 months, right shoulder, pain comes and goes, sharp pain)  Renee Gomez is a 49 y.o. female who presents today for her Complete Annual Exam. She feels well. She reports exercising rides bike 1 hour a day. She reports she is sleeping well. Breast complaints none. She is still having a period but sometimes it is extended. Also some hot flashes and changes in emotions.  Mammogram: 07/2019 DEXA: none Pap smear: 03/2019 neg with cotesting Colonoscopy: 04/2019  Immunization History  Administered Date(s) Administered  . Influenza Inj Mdck Quad With Preservative 03/26/2018  . Influenza,inj,Quad PF,6+ Mos 03/15/2015, 03/10/2017, 03/14/2019  . Influenza-Unspecified 03/08/2020  . Tdap 08/28/2015    Shoulder Pain  The pain is present in the right shoulder. This is a new problem. Episode onset: about 5 months ago. The problem occurs daily. The problem has been unchanged. The pain is moderate. Pertinent negatives include no fever. The symptoms are aggravated by activity.    Lab Results  Component Value Date   CREATININE 0.57 03/29/2019   BUN 9 03/29/2019   NA 138 03/29/2019   K 3.8 03/29/2019   CL 103 03/29/2019   CO2 25 03/29/2019   Lab Results  Component Value Date   CHOL 246 (H) 03/29/2019   HDL 54 03/29/2019   LDLCALC 172 (H) 03/29/2019   TRIG 110 03/29/2019   CHOLHDL 4.6 (H) 03/29/2019   Lab Results  Component Value Date   TSH 2.670 03/29/2019   No results found for: HGBA1C Lab Results  Component Value Date   WBC 6.4 03/29/2019   HGB 10.7 (L) 03/29/2019   HCT 34.4 03/29/2019   MCV 87 03/29/2019   PLT 286 03/29/2019   Lab Results  Component Value Date   ALT 12 03/29/2019   AST 18 03/29/2019   ALKPHOS 52 03/29/2019   BILITOT 0.3 03/29/2019     Review of Systems  Constitutional: Negative for chills,  fatigue and fever. Diaphoresis: hot flashes at times.  HENT: Negative for congestion, hearing loss, tinnitus, trouble swallowing and voice change.   Eyes: Negative for visual disturbance.  Respiratory: Negative for cough, chest tightness, shortness of breath and wheezing.   Cardiovascular: Negative for chest pain, palpitations and leg swelling.  Gastrointestinal: Negative for abdominal pain, constipation, diarrhea and vomiting.  Endocrine: Negative for polydipsia and polyuria.  Genitourinary: Negative for dysuria, frequency, genital sores, vaginal bleeding and vaginal discharge.  Musculoskeletal: Positive for arthralgias and myalgias (in right shoulder). Negative for gait problem and joint swelling.  Skin: Positive for rash (mild itching on neck). Negative for color change.  Neurological: Negative for dizziness, tremors, light-headedness and headaches.  Hematological: Negative for adenopathy. Does not bruise/bleed easily.  Psychiatric/Behavioral: Negative for dysphoric mood and sleep disturbance. The patient is not nervous/anxious.     Patient Active Problem List   Diagnosis Date Noted  . Plantar wart of right foot 10/03/2019  . Encounter for screening colonoscopy   . 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  . Vertigo, intermittent 03/15/2018  . Low back pain with sciatica 01/18/2015  . Fibrocystic breast 01/18/2015  . Acid reflux 01/18/2015  . Arthralgia of hip 01/18/2015  . Hyperlipidemia, mild 01/18/2015  . Knee pain, right 01/18/2015  . Tendinitis of elbow or forearm 01/18/2015    No Known Allergies  Past  Surgical History:  Procedure Laterality Date  . APPENDECTOMY    . BREAST EXCISIONAL BIOPSY Bilateral 25+yrs ago   neg  . BREAST SURGERY    . COLONOSCOPY WITH PROPOFOL N/A 04/24/2019   Procedure: COLONOSCOPY WITH PROPOFOL;  Surgeon: Lin Landsman, MD;  Location: [facility] ENDOSCOPY;  Service: Gastroenterology;  Laterality: N/A;  . JOINT REPLACEMENT    .  LITHOTRIPSY  1995  . MINOR BREAST BIOPSY Bilateral    neg x 2  . TOTAL HIP ARTHROPLASTY Right 12/2012    Social History   Tobacco Use  . Smoking status: Never Smoker  . Smokeless tobacco: Never Used  Vaping Use  . Vaping Use: Never used  Substance Use Topics  . Alcohol use: No    Alcohol/week: 0.0 standard drinks  . Drug use: No     Medication list has been reviewed and updated.  No outpatient medications have been marked as taking for the 04/02/20 encounter (Office Visit) with Glean Hess, MD.    [facility] 2/9 Scores 04/02/2020 10/03/2019 08/08/2019 03/29/2019  PHQ - 2 Score 0 0 0 0  PHQ- 9 Score 0 0 2 0    GAD 7 : Generalized Anxiety Score 04/02/2020  Nervous, Anxious, on Edge 0  Control/stop worrying 0  Worry too much - different things 0  Trouble relaxing 0  Restless 0  Easily annoyed or irritable 0  Afraid - awful might happen 0  Total GAD 7 Score 0  Anxiety Difficulty Not difficult at all    BP Readings from Last 3 Encounters:  04/02/20 110/76  10/03/19 108/68  08/08/19 108/82    Physical Exam Vitals and nursing note reviewed.  Constitutional:      General: She is not in acute distress.    Appearance: She is well-developed.  HENT:     Head: Normocephalic and atraumatic.     Right Ear: Tympanic membrane and ear canal normal.     Left Ear: Tympanic membrane and ear canal normal.     Nose:     Right Sinus: No maxillary sinus tenderness.     Left Sinus: No maxillary sinus tenderness.  Eyes:     General: No scleral icterus.       Right eye: No discharge.        Left eye: No discharge.     Conjunctiva/sclera: Conjunctivae normal.  Neck:     Thyroid: No thyromegaly.     Vascular: No carotid bruit.  Cardiovascular:     Rate and Rhythm: Normal rate and regular rhythm.     Pulses: Normal pulses.     Heart sounds: Normal heart sounds.  Pulmonary:     Effort: Pulmonary effort is normal. No respiratory distress.     Breath sounds: No wheezing.  Chest:       Breasts:        Right: No mass, nipple discharge, skin change or tenderness.        Left: No mass, nipple discharge, skin change or tenderness.  Abdominal:     General: Bowel sounds are normal.     Palpations: Abdomen is soft.     Tenderness: There is no abdominal tenderness.  Musculoskeletal:     Right shoulder: Tenderness (over trapezius) present. No bony tenderness. Normal range of motion.     Left shoulder: Normal.     Cervical back: Spasms present. No erythema. Decreased range of motion.     Right hip: Decreased range of motion.     Left hip: Decreased  range of motion.     Right lower leg: No edema.     Left lower leg: No edema.  Lymphadenopathy:     Cervical: No cervical adenopathy.  Skin:    General: Skin is warm and dry.     Findings: No rash.  Neurological:     Mental Status: She is alert and oriented to person, place, and time.     Cranial Nerves: No cranial nerve deficit.     Sensory: No sensory deficit.     Deep Tendon Reflexes: Reflexes are normal and symmetric.  Psychiatric:        Attention and Perception: Attention normal.        Mood and Affect: Mood normal.     Wt Readings from Last 3 Encounters:  04/02/20 123 lb (55.8 kg)  10/03/19 122 lb (55.3 kg)  08/08/19 118 lb (53.5 kg)    BP 110/76 (BP Location: Right Arm, Patient Position: Sitting)   Pulse 79   Temp 98.5 F (36.9 C) (Oral)   Ht 5\' 1"  (1.549 m)   Wt 123 lb (55.8 kg)   LMP 03/11/2020   SpO2 99%   BMI 23.24 kg/m   Assessment and Plan: 1. Annual physical exam Normal exam Continue healthy diet and exercise Peri-menopausal sx are mild If worsening, can follow up for advice - CBC with Differential/Platelet - Comprehensive metabolic panel - TSH - POCT urinalysis dipstick  2. Encounter for screening mammogram for breast cancer Schedule in [place] - MM 3D SCREEN BREAST BILATERAL; Future  3. Hyperlipidemia, mild - Lipid panel  4. Need for hepatitis C screening test - Hepatitis C  antibody  5. Muscle spasm of right shoulder Begin robaxin at bedtime - can take tid if not sedating Use heat and massage - methocarbamol (ROBAXIN) 500 MG tablet; Take 1 tablet (500 mg total) by mouth every 8 (eight) hours as needed for muscle spasms.  Dispense: 60 tablet; Refill: 0  6. Encounter for screening for osteoporosis Sister with OP on treatment With hx of hip dysplasia and hip replacement will get DEXA - DG Bone Density; Future   Partially dictated using [employer]. Any errors are unintentional.  Halina Maidens, MD [facility]  04/02/2020

## 2020-04-02 ENCOUNTER — Encounter: Payer: Self-pay | Admitting: Internal Medicine

## 2020-04-02 ENCOUNTER — Other Ambulatory Visit: Payer: Self-pay

## 2020-04-02 ENCOUNTER — Ambulatory Visit (INDEPENDENT_AMBULATORY_CARE_PROVIDER_SITE_OTHER): Admitting: Internal Medicine

## 2020-04-02 VITALS — BP 110/76 | HR 79 | Temp 98.5°F | Ht 61.0 in | Wt 123.0 lb

## 2020-04-02 DIAGNOSIS — Z0000 Encounter for general adult medical examination without abnormal findings: Secondary | ICD-10-CM | POA: Diagnosis not present

## 2020-04-02 DIAGNOSIS — Z1159 Encounter for screening for other viral diseases: Secondary | ICD-10-CM | POA: Diagnosis not present

## 2020-04-02 DIAGNOSIS — Z1231 Encounter for screening mammogram for malignant neoplasm of breast: Secondary | ICD-10-CM

## 2020-04-02 DIAGNOSIS — Z13820 Encounter for screening for osteoporosis: Secondary | ICD-10-CM

## 2020-04-02 DIAGNOSIS — E785 Hyperlipidemia, unspecified: Secondary | ICD-10-CM

## 2020-04-02 DIAGNOSIS — M62838 Other muscle spasm: Secondary | ICD-10-CM | POA: Diagnosis not present

## 2020-04-02 LAB — POCT URINALYSIS DIPSTICK
Bilirubin, UA: NEGATIVE
Blood, UA: NEGATIVE
Glucose, UA: NEGATIVE
Ketones, UA: NEGATIVE
Leukocytes, UA: NEGATIVE
Nitrite, UA: NEGATIVE
Protein, UA: NEGATIVE
Spec Grav, UA: <=1.005 — AB (ref 1.010–1.025)
Urobilinogen, UA: 0.2 E.U./dL
pH, UA: 7 (ref 5.0–8.0)

## 2020-04-02 MED ORDER — METHOCARBAMOL 500 MG PO TABS: 500.0000 mg | ORAL_TABLET | Freq: Three times a day (TID) | ORAL | 0 refills | Status: DC | PRN

## 2020-04-02 NOTE — Patient Instructions (Signed)
Take muscle relaxant at bedtime for several weeks.  Use heat several times per day  Massage the shoulder with topical rubs as needed

## 2020-04-03 LAB — COMPREHENSIVE METABOLIC PANEL
ALT: 12 IU/L (ref 0–32)
AST: 17 IU/L (ref 0–40)
Albumin/Globulin Ratio: 1.4 (ref 1.2–2.2)
Albumin: 4.3 g/dL (ref 3.8–4.9)
Alkaline Phosphatase: 59 IU/L (ref 44–121)
BUN/Creatinine Ratio: 13 (ref 9–23)
BUN: 7 mg/dL (ref 6–24)
Bilirubin Total: 0.3 mg/dL (ref 0.0–1.2)
CO2: 23 mmol/L (ref 20–29)
Calcium: 9 mg/dL (ref 8.7–10.2)
Chloride: 104 mmol/L (ref 96–106)
Creatinine, Ser: 0.54 mg/dL — ABNORMAL LOW (ref 0.57–1.00)
GFR calc Af Amer: 126 mL/min/{1.73_m2} (ref >59)
GFR calc non Af Amer: 110 mL/min/{1.73_m2} (ref >59)
Globulin, Total: 3.1 g/dL (ref 1.5–4.5)
Glucose: 89 mg/dL (ref 65–99)
Potassium: 4 mmol/L (ref 3.5–5.2)
Sodium: 141 mmol/L (ref 134–144)
Total Protein: 7.4 g/dL (ref 6.0–8.5)

## 2020-04-03 LAB — LIPID PANEL
Chol/HDL Ratio: 5.3 ratio — ABNORMAL HIGH (ref 0.0–4.4)
Cholesterol, Total: 243 mg/dL — ABNORMAL HIGH (ref 100–199)
HDL: 46 mg/dL (ref >39)
LDL Chol Calc (NIH): 168 mg/dL — ABNORMAL HIGH (ref 0–99)
Triglycerides: 158 mg/dL — ABNORMAL HIGH (ref 0–149)
VLDL Cholesterol Cal: 29 mg/dL (ref 5–40)

## 2020-04-03 LAB — CBC WITH DIFFERENTIAL/PLATELET
Basophils Absolute: 0 10*3/uL (ref 0.0–0.2)
Basos: 1 %
EOS (ABSOLUTE): 0.1 10*3/uL (ref 0.0–0.4)
Eos: 1 %
Hematocrit: 38.7 % (ref 34.0–46.6)
Hemoglobin: 12.3 g/dL (ref 11.1–15.9)
Immature Grans (Abs): 0 10*3/uL (ref 0.0–0.1)
Immature Granulocytes: 0 %
Lymphocytes Absolute: 2.2 10*3/uL (ref 0.7–3.1)
Lymphs: 33 %
MCH: 28.7 pg (ref 26.6–33.0)
MCHC: 31.8 g/dL (ref 31.5–35.7)
MCV: 90 fL (ref 79–97)
Monocytes Absolute: 0.6 10*3/uL (ref 0.1–0.9)
Monocytes: 8 %
Neutrophils Absolute: 3.7 10*3/uL (ref 1.4–7.0)
Neutrophils: 57 %
Platelets: 244 10*3/uL (ref 150–450)
RBC: 4.28 x10E6/uL (ref 3.77–5.28)
RDW: 13.1 % (ref 11.7–15.4)
WBC: 6.6 10*3/uL (ref 3.4–10.8)

## 2020-04-03 LAB — HEPATITIS C ANTIBODY: Hep C Virus Ab: <0.1 s/co ratio (ref 0.0–0.9)

## 2020-04-03 LAB — TSH: TSH: 3.73 u[IU]/mL (ref 0.450–4.500)

## 2020-04-05 ENCOUNTER — Telehealth: Payer: Self-pay

## 2020-04-05 NOTE — Telephone Encounter (Signed)
Pt. Given lab results, verbalizes understanding. 

## 2020-08-01 ENCOUNTER — Ambulatory Visit
Admission: RE | Admit: 2020-08-01 | Discharge: 2020-08-01 | Disposition: A | Discharge status: Home / Self Care | Source: Ambulatory Visit | Attending: Internal Medicine | Admitting: Internal Medicine

## 2020-08-01 ENCOUNTER — Other Ambulatory Visit: Payer: Self-pay

## 2020-08-01 DIAGNOSIS — Z1231 Encounter for screening mammogram for malignant neoplasm of breast: Secondary | ICD-10-CM | POA: Diagnosis present

## 2020-08-01 DIAGNOSIS — Z13820 Encounter for screening for osteoporosis: Secondary | ICD-10-CM | POA: Insufficient documentation

## 2020-08-08 ENCOUNTER — Other Ambulatory Visit: Payer: Self-pay

## 2020-08-08 ENCOUNTER — Ambulatory Visit
Admission: RE | Admit: 2020-08-08 | Discharge: 2020-08-08 | Disposition: A | Discharge status: Home / Self Care | Source: Ambulatory Visit | Attending: Internal Medicine | Admitting: Internal Medicine

## 2020-08-08 DIAGNOSIS — Z1231 Encounter for screening mammogram for malignant neoplasm of breast: Secondary | ICD-10-CM | POA: Insufficient documentation

## 2020-12-30 ENCOUNTER — Other Ambulatory Visit: Payer: Self-pay | Admitting: Family Medicine

## 2020-12-30 DIAGNOSIS — M5412 Radiculopathy, cervical region: Secondary | ICD-10-CM

## 2021-01-06 ENCOUNTER — Telehealth: Payer: Self-pay | Admitting: Internal Medicine

## 2021-01-06 ENCOUNTER — Other Ambulatory Visit: Payer: Self-pay

## 2021-01-06 ENCOUNTER — Ambulatory Visit
Admission: RE | Admit: 2021-01-06 | Discharge: 2021-01-06 | Disposition: A | Discharge status: Home / Self Care | Source: Ambulatory Visit | Attending: Family Medicine | Admitting: Family Medicine

## 2021-01-06 DIAGNOSIS — M5412 Radiculopathy, cervical region: Secondary | ICD-10-CM | POA: Insufficient documentation

## 2021-01-06 MED ORDER — AMOXICILLIN 500 MG PO CAPS: 2000.0000 mg | ORAL_CAPSULE | Freq: Once | ORAL | 1 refills | Status: AC

## 2021-01-06 NOTE — Telephone Encounter (Signed)
Sent pt in amoxicillin for pt.  KP

## 2021-01-06 NOTE — Telephone Encounter (Signed)
Pts husband calling on behalf of the pt. He states that she is needing to have a prescription for amoxicillin for her upcoming dental procedure tomorrow morning. He is requesting to have this sent in today. Please advise.      [facility] [id number], [address]  [place] [address]  Phone: [phone] Fax: [phone]  Hours: Not open 24 hours

## 2021-04-04 ENCOUNTER — Ambulatory Visit (INDEPENDENT_AMBULATORY_CARE_PROVIDER_SITE_OTHER): Admitting: Internal Medicine

## 2021-04-04 ENCOUNTER — Encounter: Payer: Self-pay | Admitting: Internal Medicine

## 2021-04-04 ENCOUNTER — Other Ambulatory Visit: Payer: Self-pay

## 2021-04-04 VITALS — BP 112/68 | HR 88 | Ht 61.0 in | Wt 125.0 lb

## 2021-04-04 DIAGNOSIS — Z1231 Encounter for screening mammogram for malignant neoplasm of breast: Secondary | ICD-10-CM | POA: Diagnosis not present

## 2021-04-04 DIAGNOSIS — E785 Hyperlipidemia, unspecified: Secondary | ICD-10-CM

## 2021-04-04 DIAGNOSIS — R002 Palpitations: Secondary | ICD-10-CM

## 2021-04-04 DIAGNOSIS — Z0000 Encounter for general adult medical examination without abnormal findings: Secondary | ICD-10-CM | POA: Diagnosis not present

## 2021-04-04 NOTE — Progress Notes (Signed)
Date:  04/04/2021   Name:  Renee Gomez   DOB:  08/09/1970   MRN:  [id number]   Chief Complaint: Annual Exam Renee Gomez Renee Gomez is a 50 y.o. female who presents today for her Complete Annual Exam. She feels fairly well. She reports exercising - biking and swimming. She reports she is sleeping poorly. Breast complaints - none.  Mammogram: 08/2020 DEXA: 08/2020 borderline osteopenia Pap smear: 03/2019 neg with co-testing; pt requests Pap every 3 yrs Colonoscopy: 04/2019 repeat 10 yrs  Immunization History  Administered Date(s) Administered   Influenza Inj Mdck Quad With Preservative 03/26/2018   Influenza,inj,Quad PF,6+ Mos 03/15/2015, 03/10/2017, 03/14/2019   Influenza-Unspecified 03/08/2020   MODERNA COVID-19 SARS-COV-2 PEDS BIVALENT BOOSTER 6Y-11Y 01/17/2020   Tdap 08/28/2015    HPI  Lab Results  Component Value Date   CREATININE 0.54 (L) 04/02/2020   BUN 7 04/02/2020   NA 141 04/02/2020   K 4.0 04/02/2020   CL 104 04/02/2020   CO2 23 04/02/2020   Lab Results  Component Value Date   CHOL 243 (H) 04/02/2020   HDL 46 04/02/2020   LDLCALC 168 (H) 04/02/2020   TRIG 158 (H) 04/02/2020   CHOLHDL 5.3 (H) 04/02/2020   Lab Results  Component Value Date   TSH 3.730 04/02/2020   No results found for: HGBA1C Lab Results  Component Value Date   WBC 6.6 04/02/2020   HGB 12.3 04/02/2020   HCT 38.7 04/02/2020   MCV 90 04/02/2020   PLT 244 04/02/2020   Lab Results  Component Value Date   ALT 12 04/02/2020   AST 17 04/02/2020   ALKPHOS 59 04/02/2020   BILITOT 0.3 04/02/2020     Review of Systems  Constitutional:  Negative for chills, fatigue and fever.  HENT:  Negative for congestion, hearing loss, tinnitus, trouble swallowing and voice change.   Eyes:  Negative for visual disturbance.  Respiratory:  Negative for cough, chest tightness, shortness of breath and wheezing.   Cardiovascular:  Positive for palpitations. Negative for chest pain and leg swelling.   Gastrointestinal:  Negative for abdominal pain, constipation, diarrhea and vomiting.  Endocrine: Negative for polydipsia and polyuria.  Genitourinary:  Negative for dysuria, frequency, genital sores, vaginal bleeding and vaginal discharge.  Musculoskeletal:  Negative for arthralgias, gait problem and joint swelling.  Skin:  Negative for color change and rash.  Neurological:  Negative for dizziness, tremors, light-headedness and headaches.  Hematological:  Negative for adenopathy. Does not bruise/bleed easily.  Psychiatric/Behavioral:  Negative for dysphoric mood and sleep disturbance. The patient is not nervous/anxious.    Patient Active Problem List   Diagnosis Date Noted   Plantar wart of right foot 10/03/2019   Encounter for screening colonoscopy    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   Vertigo, intermittent 03/15/2018   Low back pain with sciatica 01/18/2015   Fibrocystic breast 01/18/2015   Acid reflux 01/18/2015   Arthralgia of hip 01/18/2015   Hyperlipidemia, mild 01/18/2015   Knee pain, right 01/18/2015   Tendinitis of elbow or forearm 01/18/2015    No Known Allergies  Past Surgical History:  Procedure Laterality Date   APPENDECTOMY     BREAST EXCISIONAL BIOPSY Bilateral 25+yrs ago   neg   BREAST SURGERY     COLONOSCOPY WITH PROPOFOL N/A 04/24/2019   Procedure: COLONOSCOPY WITH PROPOFOL;  Surgeon: Lin Landsman, MD;  Location: [facility];  Service: Gastroenterology;  Laterality: N/A;   JOINT REPLACEMENT     LITHOTRIPSY  1995   MINOR BREAST BIOPSY Bilateral    neg x 2   TOTAL HIP ARTHROPLASTY Right 12/2012    Social History   Tobacco Use   Smoking status: Never   Smokeless tobacco: Never  Vaping Use   Vaping Use: Never used  Substance Use Topics   Alcohol use: No    Alcohol/week: 0.0 standard drinks   Drug use: No     Medication list has been reviewed and updated.  Current Meds  Medication Sig   [DISCONTINUED] methocarbamol  (ROBAXIN) 500 MG tablet Take 1 tablet (500 mg total) by mouth every 8 (eight) hours as needed for muscle spasms.   [DISCONTINUED] tiZANidine (ZANAFLEX) 4 MG tablet     PHQ 2/9 Scores 04/04/2021 04/02/2020 10/03/2019 08/08/2019  PHQ - 2 Score 1 0 0 0  PHQ- 9 Score 8 0 0 2    GAD 7 : Generalized Anxiety Score 04/04/2021 04/02/2020  Nervous, Anxious, on Edge 1 0  Control/stop worrying 1 0  Worry too much - different things 1 0  Trouble relaxing 1 0  Restless 0 0  Easily annoyed or irritable 1 0  Afraid - awful might happen 1 0  Total GAD 7 Score 6 0  Anxiety Difficulty Not difficult at all Not difficult at all    BP Readings from Last 3 Encounters:  04/04/21 112/68  04/02/20 110/76  10/03/19 108/68    Physical Exam Vitals and nursing note reviewed.  Constitutional:      General: She is not in acute distress.    Appearance: She is well-developed.  HENT:     Head: Normocephalic and atraumatic.     Right Ear: Tympanic membrane and ear canal normal.     Left Ear: Tympanic membrane and ear canal normal.     Nose:     Right Sinus: No maxillary sinus tenderness.     Left Sinus: No maxillary sinus tenderness.  Eyes:     General: No scleral icterus.       Right eye: No discharge.        Left eye: No discharge.     Conjunctiva/sclera: Conjunctivae normal.  Neck:     Thyroid: No thyromegaly.     Vascular: No carotid bruit.  Cardiovascular:     Rate and Rhythm: Normal rate and regular rhythm.     Pulses: Normal pulses.     Heart sounds: Normal heart sounds.  Pulmonary:     Effort: Pulmonary effort is normal. No respiratory distress.     Breath sounds: No wheezing.  Chest:  Breasts:    Right: No mass, nipple discharge, skin change or tenderness.     Left: No mass, nipple discharge, skin change or tenderness.  Abdominal:     General: Abdomen is flat. Bowel sounds are normal.     Palpations: Abdomen is soft.     Tenderness: There is no abdominal tenderness. There is no right  CVA tenderness, left CVA tenderness, guarding or rebound.  Musculoskeletal:     Cervical back: Normal range of motion. No erythema.     Right lower leg: No edema.     Left lower leg: No edema.  Lymphadenopathy:     Cervical: No cervical adenopathy.  Skin:    General: Skin is warm and dry.     Capillary Refill: Capillary refill takes less than 2 seconds.     Findings: No rash.  Neurological:     General: No focal deficit present.     Mental  Status: She is alert and oriented to person, place, and time.     Cranial Nerves: No cranial nerve deficit.     Sensory: No sensory deficit.     Deep Tendon Reflexes: Reflexes are normal and symmetric.  Psychiatric:        Attention and Perception: Attention normal.        Mood and Affect: Mood normal.    Wt Readings from Last 3 Encounters:  04/04/21 125 lb (56.7 kg)  04/02/20 123 lb (55.8 kg)  10/03/19 122 lb (55.3 kg)    BP 112/68   Pulse 88   Ht 5\' 1"  (1.549 m)   Wt 125 lb (56.7 kg)   SpO2 99%   BMI 23.62 kg/m   Assessment and Plan: 1. Annual physical exam Normal exam; up to date on screenings and immunizations Continue healthy diet, exercise. Pap next year per patient request Take calcium and vitamin D daily - CBC with Differential/Platelet  2. Encounter for screening mammogram for breast cancer Due in March - MM 3D SCREEN BREAST BILATERAL  3. Hyperlipidemia, mild Now taking over the counter cholesterol lowering supplement ?cholestoff - Lipid panel  4. Palpitations Having single episodes about once every week - no clear triggers EKG is normal today. If worsening, will consider Zio patch - Comprehensive metabolic panel - TSH - EKG 12-Lead - SR @ 70 low voltage unchanged from 2017   Partially dictated using [employer]. Any errors are unintentional.  Halina Maidens, MD [facility]  04/04/2021

## 2021-04-05 LAB — LIPID PANEL
Chol/HDL Ratio: 4.2 ratio (ref 0.0–4.4)
Cholesterol, Total: 255 mg/dL — ABNORMAL HIGH (ref 100–199)
HDL: 61 mg/dL (ref >39)
LDL Chol Calc (NIH): 178 mg/dL — ABNORMAL HIGH (ref 0–99)
Triglycerides: 91 mg/dL (ref 0–149)
VLDL Cholesterol Cal: 16 mg/dL (ref 5–40)

## 2021-04-05 LAB — CBC WITH DIFFERENTIAL/PLATELET
Basophils Absolute: 0 10*3/uL (ref 0.0–0.2)
Basos: 0 %
EOS (ABSOLUTE): 0.1 10*3/uL (ref 0.0–0.4)
Eos: 1 %
Hematocrit: 39.7 % (ref 34.0–46.6)
Hemoglobin: 12.7 g/dL (ref 11.1–15.9)
Immature Grans (Abs): 0 10*3/uL (ref 0.0–0.1)
Immature Granulocytes: 0 %
Lymphocytes Absolute: 2.6 10*3/uL (ref 0.7–3.1)
Lymphs: 34 %
MCH: 30.4 pg (ref 26.6–33.0)
MCHC: 32 g/dL (ref 31.5–35.7)
MCV: 95 fL (ref 79–97)
Monocytes Absolute: 0.6 10*3/uL (ref 0.1–0.9)
Monocytes: 8 %
Neutrophils Absolute: 4.2 10*3/uL (ref 1.4–7.0)
Neutrophils: 57 %
Platelets: 242 10*3/uL (ref 150–450)
RBC: 4.18 x10E6/uL (ref 3.77–5.28)
RDW: 12.3 % (ref 11.7–15.4)
WBC: 7.6 10*3/uL (ref 3.4–10.8)

## 2021-04-05 LAB — COMPREHENSIVE METABOLIC PANEL
ALT: 18 IU/L (ref 0–32)
AST: 23 IU/L (ref 0–40)
Albumin/Globulin Ratio: 1.7 (ref 1.2–2.2)
Albumin: 4.4 g/dL (ref 3.8–4.9)
Alkaline Phosphatase: 61 IU/L (ref 44–121)
BUN/Creatinine Ratio: 19 (ref 9–23)
BUN: 11 mg/dL (ref 6–24)
Bilirubin Total: 0.4 mg/dL (ref 0.0–1.2)
CO2: 25 mmol/L (ref 20–29)
Calcium: 9.3 mg/dL (ref 8.7–10.2)
Chloride: 101 mmol/L (ref 96–106)
Creatinine, Ser: 0.59 mg/dL (ref 0.57–1.00)
Globulin, Total: 2.6 g/dL (ref 1.5–4.5)
Glucose: 83 mg/dL (ref 70–99)
Potassium: 4.3 mmol/L (ref 3.5–5.2)
Sodium: 140 mmol/L (ref 134–144)
Total Protein: 7 g/dL (ref 6.0–8.5)
eGFR: 108 mL/min/{1.73_m2} (ref >59)

## 2021-04-05 LAB — TSH: TSH: 3.27 u[IU]/mL (ref 0.450–4.500)

## 2021-08-12 ENCOUNTER — Ambulatory Visit
Admission: RE | Admit: 2021-08-12 | Discharge: 2021-08-12 | Disposition: A | Discharge status: Home / Self Care | Source: Ambulatory Visit | Attending: Internal Medicine | Admitting: Internal Medicine

## 2021-08-12 ENCOUNTER — Other Ambulatory Visit: Payer: Self-pay

## 2021-08-12 DIAGNOSIS — Z1231 Encounter for screening mammogram for malignant neoplasm of breast: Secondary | ICD-10-CM | POA: Diagnosis not present

## 2022-04-06 ENCOUNTER — Ambulatory Visit (INDEPENDENT_AMBULATORY_CARE_PROVIDER_SITE_OTHER): Admitting: Internal Medicine

## 2022-04-06 ENCOUNTER — Encounter: Payer: Self-pay | Admitting: Internal Medicine

## 2022-04-06 VITALS — BP 128/76 | HR 74 | Ht 61.0 in | Wt 127.0 lb

## 2022-04-06 DIAGNOSIS — Z1231 Encounter for screening mammogram for malignant neoplasm of breast: Secondary | ICD-10-CM | POA: Diagnosis not present

## 2022-04-06 DIAGNOSIS — Z23 Encounter for immunization: Secondary | ICD-10-CM

## 2022-04-06 DIAGNOSIS — Z0000 Encounter for general adult medical examination without abnormal findings: Secondary | ICD-10-CM | POA: Diagnosis not present

## 2022-04-06 DIAGNOSIS — K219 Gastro-esophageal reflux disease without esophagitis: Secondary | ICD-10-CM

## 2022-04-06 DIAGNOSIS — Z131 Encounter for screening for diabetes mellitus: Secondary | ICD-10-CM | POA: Diagnosis not present

## 2022-04-06 DIAGNOSIS — E785 Hyperlipidemia, unspecified: Secondary | ICD-10-CM | POA: Diagnosis not present

## 2022-04-06 NOTE — Patient Instructions (Signed)
Call [facility] to schedule your mammogram at [phone] - due in March.

## 2022-04-06 NOTE — Progress Notes (Signed)
Date:  04/06/2022   Name:  Renee Gomez   DOB:  [birth year]   MRN:  [id number]   Chief Complaint: Annual Exam Renee Gomez is a 51 y.o. female who presents today for her Complete Annual Exam. She feels fairly well. She reports exercising some. She reports she is sleeping poorly. Breast complaints - none.  Mammogram: 08/2021 DEXA: 08/01/20 osteopenia Pap smear: 03/2019 neg/neg Colonoscopy: 04/2019 repeat 10 yrs  Health Maintenance Due  Topic Date Due   HIV Screening  Never done   Zoster Vaccines- Shingrix (1 of 2) Never done    Immunization History  Administered Date(s) Administered   Influenza Inj Mdck Quad With Preservative 03/26/2018   Influenza,inj,Quad PF,6+ Mos 03/15/2015, 03/10/2017, 03/14/2019   Influenza-Unspecified 03/08/2020, 03/08/2022   MODERNA COVID-19 SARS-COV-2 PEDS BIVALENT BOOSTER 6Y-11Y 01/17/2020   Tdap 08/28/2015    HPI  Lab Results  Component Value Date   NA 140 04/04/2021   K 4.3 04/04/2021   CO2 25 04/04/2021   GLUCOSE 83 04/04/2021   BUN 11 04/04/2021   CREATININE 0.59 04/04/2021   CALCIUM 9.3 04/04/2021   EGFR 108 04/04/2021   GFRNONAA 110 04/02/2020   Lab Results  Component Value Date   CHOL 255 (H) 04/04/2021   HDL 61 04/04/2021   LDLCALC 178 (H) 04/04/2021   TRIG 91 04/04/2021   CHOLHDL 4.2 04/04/2021   Lab Results  Component Value Date   TSH 3.270 04/04/2021   No results found for: "HGBA1C" Lab Results  Component Value Date   WBC 7.6 04/04/2021   HGB 12.7 04/04/2021   HCT 39.7 04/04/2021   MCV 95 04/04/2021   PLT 242 04/04/2021   Lab Results  Component Value Date   ALT 18 04/04/2021   AST 23 04/04/2021   ALKPHOS 61 04/04/2021   BILITOT 0.4 04/04/2021   No results found for: "25OHVITD2", "25OHVITD3", "VD25OH"   Review of Systems  Constitutional:  Negative for chills, fatigue and fever.  HENT:  Negative for congestion, hearing loss, tinnitus, trouble swallowing and voice change.   Eyes:  Negative for visual  disturbance.  Respiratory:  Negative for cough, chest tightness, shortness of breath and wheezing.   Cardiovascular:  Negative for chest pain, palpitations and leg swelling.  Gastrointestinal:  Negative for abdominal pain, constipation, diarrhea and vomiting.  Endocrine: Negative for polydipsia and polyuria.  Genitourinary:  Negative for dysuria, frequency, genital sores, vaginal bleeding and vaginal discharge.  Musculoskeletal:  Positive for arthralgias and gait problem. Negative for joint swelling.  Skin:  Negative for color change and rash.  Neurological:  Negative for dizziness, tremors, light-headedness and headaches.  Hematological:  Negative for adenopathy. Does not bruise/bleed easily.  Psychiatric/Behavioral:  Negative for dysphoric mood and sleep disturbance. The patient is not nervous/anxious.     Patient Active Problem List   Diagnosis Date Noted   Plantar wart of right foot 10/03/2019   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   Vertigo, intermittent 03/15/2018   Low back pain with sciatica 01/18/2015   Fibrocystic breast 01/18/2015   Acid reflux 01/18/2015   Arthralgia of hip 01/18/2015   Hyperlipidemia, mild 01/18/2015   Knee pain, right 01/18/2015    No Known Allergies  Past Surgical History:  Procedure Laterality Date   APPENDECTOMY     BREAST EXCISIONAL BIOPSY Bilateral 25+yrs ago   neg   BREAST SURGERY     COLONOSCOPY WITH PROPOFOL N/A 04/24/2019   Procedure: COLONOSCOPY WITH PROPOFOL;  Surgeon: Sherri Sear  Reece Levy, MD;  Location: [facility];  Service: Gastroenterology;  Laterality: N/A;   JOINT REPLACEMENT     LITHOTRIPSY  1995   MINOR BREAST BIOPSY Bilateral    neg x 2   TOTAL HIP ARTHROPLASTY Right 12/2012    Social History   Tobacco Use   Smoking status: Never   Smokeless tobacco: Never  Vaping Use   Vaping Use: Never used  Substance Use Topics   Alcohol use: No    Alcohol/week: 0.0 standard drinks of alcohol   Drug use: No      Medication list has been reviewed and updated.  Current Meds  Medication Sig   Calcium Carbonate-Vitamin D (CALCIUM PLUS VITAMIN D PO) Take by mouth daily at 6 (six) AM.   glucosamine-chondroitin 500-400 MG tablet Take 1 tablet by mouth 3 (three) times daily.   Plant Sterols and Stanols (CHOLESTOFF PO) Take by mouth.       04/06/2022   10:53 AM 04/04/2021    9:48 AM 04/02/2020   10:37 AM  GAD 7 : Generalized Anxiety Score  Nervous, Anxious, on Edge 1 1 0  Control/stop worrying 1 1 0  Worry too much - different things 2 1 0  Trouble relaxing 1 1 0  Restless 0 0 0  Easily annoyed or irritable 1 1 0  Afraid - awful might happen 1 1 0  Total GAD 7 Score 7 6 0  Anxiety Difficulty Somewhat difficult Not difficult at all Not difficult at all       04/06/2022   10:53 AM 04/04/2021    9:48 AM 04/02/2020   10:37 AM  Depression screen PHQ 2/9  Decreased Interest 1 0 0  Down, Depressed, Hopeless 3 1 0  PHQ - 2 Score 4 1 0  Altered sleeping 3 1 0  Tired, decreased energy 3 2 0  Change in appetite 1 1 0  Feeling bad or failure about yourself  2 2 0  Trouble concentrating 1 0 0  Moving slowly or fidgety/restless 1 0 0  Suicidal thoughts 1 1 0  PHQ-9 Score 16 8 0  Difficult doing work/chores Not difficult at all Somewhat difficult Not difficult at all    BP Readings from Last 3 Encounters:  04/06/22 128/76  04/04/21 112/68  04/02/20 110/76    Physical Exam Vitals and nursing note reviewed.  Constitutional:      General: She is not in acute distress.    Appearance: She is well-developed.  HENT:     Head: Normocephalic and atraumatic.     Right Ear: Tympanic membrane and ear canal normal.     Left Ear: Tympanic membrane and ear canal normal.     Nose:     Right Sinus: No maxillary sinus tenderness.     Left Sinus: No maxillary sinus tenderness.  Eyes:     General: No scleral icterus.       Right eye: No discharge.        Left eye: No discharge.      Conjunctiva/sclera: Conjunctivae normal.  Neck:     Thyroid: No thyromegaly.     Vascular: No carotid bruit.  Cardiovascular:     Rate and Rhythm: Normal rate and regular rhythm.     Pulses: Normal pulses.     Heart sounds: Normal heart sounds.  Pulmonary:     Effort: Pulmonary effort is normal. No respiratory distress.     Breath sounds: No wheezing.  Chest:  Breasts:    Right:  No mass, nipple discharge, skin change or tenderness.     Left: No mass, nipple discharge, skin change or tenderness.  Abdominal:     General: Bowel sounds are normal.     Palpations: Abdomen is soft.     Tenderness: There is no abdominal tenderness.  Musculoskeletal:     Cervical back: Normal range of motion. No erythema.     Right lower leg: No edema.     Left lower leg: No edema.  Lymphadenopathy:     Cervical: No cervical adenopathy.  Skin:    General: Skin is warm and dry.     Findings: No rash.  Neurological:     Mental Status: She is alert and oriented to person, place, and time.     Cranial Nerves: No cranial nerve deficit.     Sensory: No sensory deficit.     Deep Tendon Reflexes: Reflexes are normal and symmetric.  Psychiatric:        Attention and Perception: Attention normal.        Mood and Affect: Mood normal.     Wt Readings from Last 3 Encounters:  04/06/22 127 lb (57.6 kg)  04/04/21 125 lb (56.7 kg)  04/02/20 123 lb (55.8 kg)    BP 128/76 (BP Location: Left Arm, Patient Position: Sitting, Cuff Size: Normal)   Pulse 74   Ht 5' 1" (1.549 m)   Wt 127 lb (57.6 kg)   SpO2 98%   BMI 24.00 kg/m   Assessment and Plan: 1. Annual physical exam Normal exam. - CBC with Differential/Platelet - Comprehensive metabolic panel - Hemoglobin A1c - Lipid panel - TSH  2. Encounter for screening mammogram for breast cancer Schedule in March - MM 3D SCREEN BREAST BILATERAL  3. Hyperlipidemia, mild Continue low fat diet 10 yr risk of CAD has been low so no medications previously  prescribed - Lipid panel  4. Screening for diabetes mellitus - Hemoglobin A1c  5. Gastroesophageal reflux disease without esophagitis Occasional symptoms not requiring medications  - CBC with Differential/Platelet  6. Need for shingles vaccine First dose today - Varicella-zoster vaccine IM   Partially dictated using Editor, commissioning. Any errors are unintentional.  Halina Maidens, MD [facility]  04/06/2022

## 2022-04-07 LAB — HEMOGLOBIN A1C
Est. average glucose Bld gHb Est-mCnc: 123 mg/dL
Hgb A1c MFr Bld: 5.9 % — ABNORMAL HIGH (ref 4.8–5.6)

## 2022-04-07 LAB — LIPID PANEL
Chol/HDL Ratio: 5.3 ratio — ABNORMAL HIGH (ref 0.0–4.4)
Cholesterol, Total: 277 mg/dL — ABNORMAL HIGH (ref 100–199)
HDL: 52 mg/dL (ref >39)
LDL Chol Calc (NIH): 194 mg/dL — ABNORMAL HIGH (ref 0–99)
Triglycerides: 166 mg/dL — ABNORMAL HIGH (ref 0–149)
VLDL Cholesterol Cal: 31 mg/dL (ref 5–40)

## 2022-04-07 LAB — CBC WITH DIFFERENTIAL/PLATELET
Basophils Absolute: 0.1 10*3/uL (ref 0.0–0.2)
Basos: 1 %
EOS (ABSOLUTE): 0.1 10*3/uL (ref 0.0–0.4)
Eos: 1 %
Hematocrit: 40 % (ref 34.0–46.6)
Hemoglobin: 13.4 g/dL (ref 11.1–15.9)
Immature Grans (Abs): 0 10*3/uL (ref 0.0–0.1)
Immature Granulocytes: 0 %
Lymphocytes Absolute: 2 10*3/uL (ref 0.7–3.1)
Lymphs: 34 %
MCH: 31 pg (ref 26.6–33.0)
MCHC: 33.5 g/dL (ref 31.5–35.7)
MCV: 93 fL (ref 79–97)
Monocytes Absolute: 0.5 10*3/uL (ref 0.1–0.9)
Monocytes: 8 %
Neutrophils Absolute: 3.3 10*3/uL (ref 1.4–7.0)
Neutrophils: 56 %
Platelets: 251 10*3/uL (ref 150–450)
RBC: 4.32 x10E6/uL (ref 3.77–5.28)
RDW: 12.1 % (ref 11.7–15.4)
WBC: 6 10*3/uL (ref 3.4–10.8)

## 2022-04-07 LAB — COMPREHENSIVE METABOLIC PANEL
ALT: 13 IU/L (ref 0–32)
AST: 18 IU/L (ref 0–40)
Albumin/Globulin Ratio: 1.7 (ref 1.2–2.2)
Albumin: 4.4 g/dL (ref 3.8–4.9)
Alkaline Phosphatase: 64 IU/L (ref 44–121)
BUN/Creatinine Ratio: 16 (ref 9–23)
BUN: 9 mg/dL (ref 6–24)
Bilirubin Total: 0.3 mg/dL (ref 0.0–1.2)
CO2: 23 mmol/L (ref 20–29)
Calcium: 9.2 mg/dL (ref 8.7–10.2)
Chloride: 103 mmol/L (ref 96–106)
Creatinine, Ser: 0.56 mg/dL — ABNORMAL LOW (ref 0.57–1.00)
Globulin, Total: 2.6 g/dL (ref 1.5–4.5)
Glucose: 88 mg/dL (ref 70–99)
Potassium: 4.2 mmol/L (ref 3.5–5.2)
Sodium: 139 mmol/L (ref 134–144)
Total Protein: 7 g/dL (ref 6.0–8.5)
eGFR: 109 mL/min/{1.73_m2} (ref >59)

## 2022-04-07 LAB — TSH: TSH: 3.11 u[IU]/mL (ref 0.450–4.500)

## 2022-07-07 ENCOUNTER — Ambulatory Visit (INDEPENDENT_AMBULATORY_CARE_PROVIDER_SITE_OTHER)

## 2022-07-07 DIAGNOSIS — Z23 Encounter for immunization: Secondary | ICD-10-CM | POA: Diagnosis not present

## 2022-08-14 ENCOUNTER — Encounter

## 2022-08-17 ENCOUNTER — Ambulatory Visit
Admission: RE | Admit: 2022-08-17 | Discharge: 2022-08-17 | Disposition: A | Discharge status: Home / Self Care | Source: Ambulatory Visit | Attending: Internal Medicine | Admitting: Internal Medicine

## 2022-08-17 DIAGNOSIS — Z1231 Encounter for screening mammogram for malignant neoplasm of breast: Secondary | ICD-10-CM | POA: Insufficient documentation

## 2022-11-13 ENCOUNTER — Encounter: Payer: Self-pay | Admitting: Internal Medicine

## 2022-11-13 ENCOUNTER — Ambulatory Visit (INDEPENDENT_AMBULATORY_CARE_PROVIDER_SITE_OTHER): Admitting: Internal Medicine

## 2022-11-13 VITALS — BP 110/60 | HR 75 | Temp 98.1°F | Ht 61.0 in | Wt 124.2 lb

## 2022-11-13 DIAGNOSIS — J029 Acute pharyngitis, unspecified: Secondary | ICD-10-CM

## 2022-11-13 MED ORDER — AMOXICILLIN 875 MG PO TABS: 875.0000 mg | ORAL_TABLET | Freq: Two times a day (BID) | ORAL | 0 refills | Status: AC

## 2022-11-13 NOTE — Progress Notes (Signed)
Date:  11/13/2022   Name:  Renee Gomez   DOB:  February 23, 1971   MRN:  [id number]   Chief Complaint: Sore Throat  Sore Throat  This is a new problem. Episode onset: 3 days. The problem has been waxing and waning. There has been no fever. The pain is moderate. Associated symptoms include swollen glands and trouble swallowing. Pertinent negatives include no coughing, headaches or stridor.    Lab Results  Component Value Date   NA 139 04/06/2022   K 4.2 04/06/2022   CO2 23 04/06/2022   GLUCOSE 88 04/06/2022   BUN 9 04/06/2022   CREATININE 0.56 (L) 04/06/2022   CALCIUM 9.2 04/06/2022   EGFR 109 04/06/2022   GFRNONAA 110 04/02/2020   Lab Results  Component Value Date   CHOL 277 (H) 04/06/2022   HDL 52 04/06/2022   LDLCALC 194 (H) 04/06/2022   TRIG 166 (H) 04/06/2022   CHOLHDL 5.3 (H) 04/06/2022   Lab Results  Component Value Date   TSH 3.110 04/06/2022   Lab Results  Component Value Date   HGBA1C 5.9 (H) 04/06/2022   Lab Results  Component Value Date   WBC 6.0 04/06/2022   HGB 13.4 04/06/2022   HCT 40.0 04/06/2022   MCV 93 04/06/2022   PLT 251 04/06/2022   Lab Results  Component Value Date   ALT 13 04/06/2022   AST 18 04/06/2022   ALKPHOS 64 04/06/2022   BILITOT 0.3 04/06/2022   No results found for: "25OHVITD2", "25OHVITD3", "VD25OH"   Review of Systems  Constitutional:  Positive for fatigue. Negative for chills, diaphoresis and fever.  HENT:  Positive for sore throat and trouble swallowing. Negative for postnasal drip and sinus pressure.   Respiratory:  Negative for cough and stridor.   Cardiovascular:  Negative for chest pain.  Neurological:  Negative for headaches.  Psychiatric/Behavioral:  Negative for sleep disturbance.     Patient Active Problem List   Diagnosis Date Noted   Plantar wart of right foot 10/03/2019   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   Vertigo, intermittent 03/15/2018   Low back pain with sciatica 01/18/2015    Fibrocystic breast 01/18/2015   Acid reflux 01/18/2015   Arthralgia of hip 01/18/2015   Hyperlipidemia, mild 01/18/2015   Knee pain, right 01/18/2015    No Known Allergies  Past Surgical History:  Procedure Laterality Date   APPENDECTOMY     BREAST EXCISIONAL BIOPSY Bilateral 25+yrs ago   neg   BREAST SURGERY     COLONOSCOPY WITH PROPOFOL N/A 04/24/2019   Procedure: COLONOSCOPY WITH PROPOFOL;  Surgeon: Toney Reil, MD;  Location: [facility];  Service: Gastroenterology;  Laterality: N/A;   JOINT REPLACEMENT     LITHOTRIPSY  1995   MINOR BREAST BIOPSY Bilateral    neg x 2   TOTAL HIP ARTHROPLASTY Right 12/2012    Social History   Tobacco Use   Smoking status: Never   Smokeless tobacco: Never  Vaping Use   Vaping Use: Never used  Substance Use Topics   Alcohol use: No    Alcohol/week: 0.0 standard drinks of alcohol   Drug use: No     Medication list has been reviewed and updated.  Current Meds  Medication Sig   amoxicillin (AMOXIL) 875 MG tablet Take 1 tablet (875 mg total) by mouth 2 (two) times daily for 10 days.   Calcium Carbonate-Vitamin D (CALCIUM PLUS VITAMIN D PO) Take by mouth daily at 6 (six) AM.  glucosamine-chondroitin 500-400 MG tablet Take 1 tablet by mouth 3 (three) times daily.   Plant Sterols and Stanols (CHOLESTOFF PO) Take by mouth.       11/13/2022    1:28 PM 04/06/2022   10:53 AM 04/04/2021    9:48 AM 04/02/2020   10:37 AM  GAD 7 : Generalized Anxiety Score  Nervous, Anxious, on Edge 1 1 1  0  Control/stop worrying 0 1 1 0  Worry too much - different things 2 2 1  0  Trouble relaxing 1 1 1  0  Restless 1 0 0 0  Easily annoyed or irritable 1 1 1  0  Afraid - awful might happen 1 1 1  0  Total GAD 7 Score 7 7 6  0  Anxiety Difficulty Not difficult at all Somewhat difficult Not difficult at all Not difficult at all       11/13/2022    1:28 PM 04/06/2022   10:53 AM 04/04/2021    9:48 AM  Depression screen PHQ 2/9  Decreased  Interest 0 1 0  Down, Depressed, Hopeless 0 3 1  PHQ - 2 Score 0 4 1  Altered sleeping 0 3 1  Tired, decreased energy 2 3 2   Change in appetite 1 1 1   Feeling bad or failure about yourself  0 2 2  Trouble concentrating 1 1 0  Moving slowly or fidgety/restless 0 1 0  Suicidal thoughts 0 1 1  PHQ-9 Score 4 16 8   Difficult doing work/chores Not difficult at all Not difficult at all Somewhat difficult    BP Readings from Last 3 Encounters:  11/13/22 110/60  04/06/22 128/76  04/04/21 112/68    Physical Exam Vitals and nursing note reviewed.  Constitutional:      General: She is not in acute distress.    Appearance: She is well-developed.  HENT:     Head: Normocephalic and atraumatic.     Mouth/Throat:     Mouth: Mucous membranes are moist.     Pharynx: Posterior oropharyngeal erythema present.     Tonsils: No tonsillar exudate.  Cardiovascular:     Rate and Rhythm: Normal rate and regular rhythm.  Pulmonary:     Effort: Pulmonary effort is normal. No respiratory distress.     Breath sounds: No wheezing or rhonchi.  Skin:    General: Skin is warm and dry.     Findings: No rash.  Neurological:     Mental Status: She is alert and oriented to person, place, and time.  Psychiatric:        Mood and Affect: Mood normal.        Behavior: Behavior normal.     Wt Readings from Last 3 Encounters:  11/13/22 124 lb 3.2 oz (56.3 kg)  04/06/22 127 lb (57.6 kg)  04/04/21 125 lb (56.7 kg)    BP 110/60   Pulse 75   Temp 98.1 F (36.7 C) (Oral)   Ht 5\' 1"  (1.549 m)   Wt 124 lb 3.2 oz (56.3 kg)   SpO2 98%   BMI 23.47 kg/m   Assessment and Plan:  Problem List Items Addressed This Visit   None Visit Diagnoses     Pharyngitis, unspecified etiology    -  Primary   continue throat lozenges, warm liquids Advil tid   Relevant Medications   amoxicillin (AMOXIL) 875 MG tablet       No follow-ups on file.   Partially dictated using Dragon software, any errors are not  intentional.  Vernona Rieger  Haynes Kerns, MD [facility] [place], [place]

## 2022-12-30 ENCOUNTER — Encounter: Payer: Self-pay | Admitting: Internal Medicine

## 2022-12-30 ENCOUNTER — Ambulatory Visit: Admitting: Surgery

## 2022-12-30 ENCOUNTER — Ambulatory Visit: Payer: Self-pay

## 2022-12-30 ENCOUNTER — Encounter: Payer: Self-pay | Admitting: Surgery

## 2022-12-30 ENCOUNTER — Ambulatory Visit (INDEPENDENT_AMBULATORY_CARE_PROVIDER_SITE_OTHER): Admitting: Internal Medicine

## 2022-12-30 VITALS — BP 128/74 | HR 88 | Ht 61.0 in | Wt 119.8 lb

## 2022-12-30 VITALS — BP 134/83 | HR 82 | Temp 98.0°F | Ht 61.0 in | Wt 118.6 lb

## 2022-12-30 DIAGNOSIS — K611 Rectal abscess: Secondary | ICD-10-CM

## 2022-12-30 MED ORDER — CIPROFLOXACIN HCL 500 MG PO TABS: 500.0000 mg | ORAL_TABLET | Freq: Two times a day (BID) | ORAL | 0 refills | Status: AC

## 2022-12-30 MED ORDER — METRONIDAZOLE 500 MG PO TABS: 500.0000 mg | ORAL_TABLET | Freq: Three times a day (TID) | ORAL | 0 refills | Status: AC

## 2022-12-30 NOTE — Patient Instructions (Addendum)
Today we have drained your Abscess in the office. The numbing medication will wear off in approximately 4-8 hours. You will have some pain to the area afterwards but should not be as severe as prior to the procedure.  If you have been given antibiotics, please continue to take them after your procedure.  You may take 2 extra strength Tylenol, or 3 regular Ibuprofen tablets every 6 hours as needed for pain and discomfort.  You may shower. First remove all of the packing and wash letting the warm soapy water run over the area, rinse well, and pat dry.  Then repack the area filling the wound fully with the packing material and cover the area with gauze and tape in place. You will need to change this every day.   We will see you back as scheduled below.   If you have any questions or concerns prior to your appointment, please call our office and speak with a nurse.  Incision and Drainage Incision and drainage is a surgical procedure to open and drain a fluid-filled sac. The sac may be filled with pus, mucus, or blood. Examples of fluid-filled sacs that may need surgical drainage include cysts, skin infections (abscesses), and red lumps that develop from a ruptured cyst or a small abscess (boils). You may need this procedure if the affected area is large, painful, infected, or not healing well. Tell a health care provider about: Any allergies you have. All medicines you are taking, including vitamins, herbs, eye drops, creams, and over-the-counter medicines. Any problems you or family members have had with anesthetic medicines. Any blood disorders you have. Any surgeries you have had. Any medical conditions you have. Whether you are pregnant or may be pregnant. What are the risks? Generally, this is a safe procedure. However, problems may occur, including: Infection. Bleeding. Allergic reactions to medicines. Scarring.  What happens before the procedure? You may need an ultrasound or other  imaging tests to see how large or deep the fluid-filled sac is. You may have blood tests to check for infection. You may get a tetanus shot. You may be given antibiotic medicine to help prevent infection. Follow instructions from your health care provider about eating or drinking restrictions. Ask your health care provider about: Changing or stopping your regular medicines. This is especially important if you are taking diabetes medicines or blood thinners. Taking medicines such as aspirin and ibuprofen. These medicines can thin your blood. Do not take these medicines before your procedure if your health care provider instructs you not to. Plan to have someone take you home after the procedure. If you will be going home right after the procedure, plan to have someone stay with you for 24 hours. What happens during the procedure? To reduce your risk of infection: Your health care team will wash or sanitize their hands. Your skin will be washed with soap. You will be given one or more of the following: A medicine to help you relax (sedative). A medicine to numb the area (local anesthetic). A medicine to make you fall asleep (general anesthetic). An incision will be made in the top of the fluid-filled sac. The contents of the sac may be squeezed out, or a syringe or tube (catheter) may be used to empty the sac. The catheter may be left in place for several weeks to drain any fluid. Or, your health care provider may stitch open the edges of the incision to make a long-term opening for drainage (marsupialization). The inside of  the sac may be washed out (irrigated) with a sterile solution and packed with gauze before it is covered with a bandage (dressing). The procedure may vary among health care providers and hospitals. What happens after the procedure? Your blood pressure, heart rate, breathing rate, and blood oxygen level will be monitored often until the medicines you were given have worn  off. Do not drive for 24 hours if you received a sedative. This information is not intended to replace advice given to you by your health care provider. Make sure you discuss any questions you have with your health care provider. Document Released: 11/18/2000 Document Revised: 10/31/2015 Document Reviewed: 03/15/2015 Elsevier Interactive Patient Education  2017 [employer]   Incision and Drainage, Care After Refer to this sheet in the next few weeks. These instructions provide you with information about caring for yourself after your procedure. Your health care provider may also give you more specific instructions. Your treatment has been planned according to current medical practices, but problems sometimes occur. Call your health care provider if you have any problems or questions after your procedure. What can I expect after the procedure? After the procedure, it is common to have: Pain or discomfort around your incision site. Drainage from your incision.  Follow these instructions at home: Take over-the-counter and prescription medicines only as told by your health care provider. If you were prescribed an antibiotic medicine, take it as told by your health care provider. Do not stop taking the antibiotic even if you start to feel better. Follow instructions from your health care provider about: How to take care of your incision. When and how you should change your packing and bandage (dressing). Wash your hands with soap and water before you change your dressing. If soap and water are not available, use hand sanitizer. When you should remove your dressing. Do not take baths, swim, or use a hot tub until your health care provider approves. Keep all follow-up visits as told by your health care provider. This is important. Check your incision area every day for signs of infection. Check for: More redness, swelling, or pain. More fluid or blood. Warmth. Pus or a bad smell. Contact a  health care provider if: Your cyst or abscess returns. You have a fever. You have more redness, swelling, or pain around your incision. You have more fluid or blood coming from your incision. Your incision feels warm to the touch. You have pus or a bad smell coming from your incision. Get help right away if: You have severe pain or bleeding. You cannot eat or drink without vomiting. You have decreased urine output. You become short of breath. You have chest pain. You cough up blood. The area where the incision and drainage occurred becomes numb or it tingles. This information is not intended to replace advice given to you by your health care provider. Make sure you discuss any questions you have with your health care provider. Document Released: 08/17/2011 Document Revised: 10/25/2015 Document Reviewed: 03/15/2015 Elsevier Interactive Patient Education  2017 [employer].

## 2022-12-30 NOTE — Telephone Encounter (Signed)
Chief Complaint: Vaginal pain Symptoms: vaginal pain and possible boil in the vaginal area or rectum, 8/10 pain, itching Frequency: onset yesterday and constant Pertinent Negatives: Patient denies vaginal discharge  Disposition: [] ED /[] Urgent Care (no appt availability in office) / [x] Appointment(In office/virtual)/ []  [facility]/ [] Home Care/ [] Refused Recommended Disposition /[]  Mobile Bus/ []  Follow-up with PCP Additional Notes: Spoke to patient's husband who stated the patient reported vaginal pain onset yesterday. Husband reported the patient believes there is a "sore" in the vaginal area and it is itchy at times. Current pain is 8/10. Advised patient's husband that she will need to be evaluated. Patient and husband agreeable. Patient has been scheduled today at  1340. Reason for Disposition  [1] MILD-MODERATE pain AND [2] present > 24 hours  (Exception: Chronic pain.)  Answer Assessment - Initial Assessment Questions 1. SYMPTOM: "What's the main symptom you're concerned about?" (e.g., pain, itching, dryness)     Sore or boil in the vaginal area 2. LOCATION: "Where is the  pain located?" (e.g., inside/outside, left/right)     Vaginal area 3. ONSET: "When did the  pain  start?"     Yesterday 4. PAIN: "Is there any pain?" If Yes, ask: "How bad is it?" (Scale: 1-10; mild, moderate, severe)   -  MILD (1-3): Doesn't interfere with normal activities.    -  MODERATE (4-7): Interferes with normal activities (e.g., work or school) or awakens from sleep.     -  SEVERE (8-10): Excruciating pain, unable to do any normal activities.     8/10 5. ITCHING: "Is there any itching?" If Yes, ask: "How bad is it?" (Scale: 1-10; mild, moderate, severe)     Yes mild 6. CAUSE: "What do you think is causing the discharge?" "Have you had the same problem before? What happened then?"     No 7. OTHER SYMPTOMS: "Do you have any other symptoms?" (e.g., fever, itching, vaginal bleeding, pain  with urination, injury to genital area, vaginal foreign body)     Pain, itching and possible boil  Protocols used: Vaginal Symptoms-A-AH

## 2022-12-30 NOTE — Progress Notes (Signed)
Date:  12/30/2022   Name:  Renee Gomez   DOB:  [birth year]   MRN:  [id number]   Chief Complaint: Rectal Pain  HPI Onset late last week of pain in the rectal area.  She noted redness and swelling and difficulty sitting.  It is slightly better but still very uncomfortable.  She has not seen any drainage or bleeding.  Lab Results  Component Value Date   NA 139 04/06/2022   K 4.2 04/06/2022   CO2 23 04/06/2022   GLUCOSE 88 04/06/2022   BUN 9 04/06/2022   CREATININE 0.56 (L) 04/06/2022   CALCIUM 9.2 04/06/2022   EGFR 109 04/06/2022   GFRNONAA 110 04/02/2020   Lab Results  Component Value Date   CHOL 277 (H) 04/06/2022   HDL 52 04/06/2022   LDLCALC 194 (H) 04/06/2022   TRIG 166 (H) 04/06/2022   CHOLHDL 5.3 (H) 04/06/2022   Lab Results  Component Value Date   TSH 3.110 04/06/2022   Lab Results  Component Value Date   HGBA1C 5.9 (H) 04/06/2022   Lab Results  Component Value Date   WBC 6.0 04/06/2022   HGB 13.4 04/06/2022   HCT 40.0 04/06/2022   MCV 93 04/06/2022   PLT 251 04/06/2022   Lab Results  Component Value Date   ALT 13 04/06/2022   AST 18 04/06/2022   ALKPHOS 64 04/06/2022   BILITOT 0.3 04/06/2022   No results found for: "25OHVITD2", "25OHVITD3", "VD25OH"   Review of Systems  Gastrointestinal:        Rectal pain    Patient Active Problem List   Diagnosis Date Noted   Plantar wart of right foot 10/03/2019   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   Vertigo, intermittent 03/15/2018   Low back pain with sciatica 01/18/2015   Fibrocystic breast 01/18/2015   Acid reflux 01/18/2015   Arthralgia of hip 01/18/2015   Hyperlipidemia, mild 01/18/2015   Knee pain, right 01/18/2015    No Known Allergies  Past Surgical History:  Procedure Laterality Date   APPENDECTOMY     BREAST EXCISIONAL BIOPSY Bilateral 25+yrs ago   neg   BREAST SURGERY     COLONOSCOPY WITH PROPOFOL N/A 04/24/2019   Procedure: COLONOSCOPY WITH PROPOFOL;  Surgeon:  Toney Reil, MD;  Location: [facility];  Service: Gastroenterology;  Laterality: N/A;   JOINT REPLACEMENT     LITHOTRIPSY  1995   MINOR BREAST BIOPSY Bilateral    neg x 2   TOTAL HIP ARTHROPLASTY Right 12/2012    Social History   Tobacco Use   Smoking status: Never   Smokeless tobacco: Never  Vaping Use   Vaping status: Never Used  Substance Use Topics   Alcohol use: No    Alcohol/week: 0.0 standard drinks of alcohol   Drug use: No     Medication list has been reviewed and updated.  Current Meds  Medication Sig   Calcium Carbonate-Vitamin D (CALCIUM PLUS VITAMIN D PO) Take by mouth daily at 6 (six) AM.   glucosamine-chondroitin 500-400 MG tablet Take 1 tablet by mouth 3 (three) times daily.   Plant Sterols and Stanols (CHOLESTOFF PO) Take by mouth.       12/30/2022    1:37 PM 11/13/2022    1:28 PM 04/06/2022   10:53 AM 04/04/2021    9:48 AM  GAD 7 : Generalized Anxiety Score  Nervous, Anxious, on Edge 1 1 1 1   Control/stop worrying 1 0 1 1  Worry too much - different things 1 2 2 1   Trouble relaxing 1 1 1 1   Restless 1 1 0 0  Easily annoyed or irritable 1 1 1 1   Afraid - awful might happen 1 1 1 1   Total GAD 7 Score 7 7 7 6   Anxiety Difficulty Somewhat difficult Not difficult at all Somewhat difficult Not difficult at all       12/30/2022    1:36 PM 11/13/2022    1:28 PM 04/06/2022   10:53 AM  Depression screen PHQ 2/9  Decreased Interest 1 0 1  Down, Depressed, Hopeless 1 0 3  PHQ - 2 Score 2 0 4  Altered sleeping 2 0 3  Tired, decreased energy 2 2 3   Change in appetite 1 1 1   Feeling bad or failure about yourself  1 0 2  Trouble concentrating 1 1 1   Moving slowly or fidgety/restless 1 0 1  Suicidal thoughts 1 0 1  PHQ-9 Score 11 4 16   Difficult doing work/chores Somewhat difficult Not difficult at all Not difficult at all    BP Readings from Last 3 Encounters:  12/30/22 128/74  11/13/22 110/60  04/06/22 128/76    Physical Exam Vitals  and nursing note reviewed.  Constitutional:      General: She is in acute distress.     Appearance: She is well-developed.  HENT:     Head: Normocephalic and atraumatic.  Pulmonary:     Effort: Pulmonary effort is normal. No respiratory distress.  Genitourinary:      Comments: Large fluctuant tender mass with overlying erythema and warmth.  No drainage or opening noted.  Does not appear to communicate with the rectum. Skin:    General: Skin is warm and dry.     Findings: No rash.  Neurological:     Mental Status: She is alert and oriented to person, place, and time.  Psychiatric:        Mood and Affect: Mood normal.        Behavior: Behavior normal.     Wt Readings from Last 3 Encounters:  12/30/22 119 lb 12.8 oz (54.3 kg)  11/13/22 124 lb 3.2 oz (56.3 kg)  04/06/22 127 lb (57.6 kg)    BP 128/74   Pulse 88   Ht 5\' 1"  (1.549 m)   Wt 119 lb 12.8 oz (54.3 kg)   SpO2 97%   BMI 22.64 kg/m   Assessment and Plan:  Problem List Items Addressed This Visit   None Visit Diagnoses     Peri-rectal abscess    -  Primary   Relevant Orders   Ambulatory referral to General Surgery       No follow-ups on file.    Reubin Milan, MD [facility] [place]

## 2022-12-31 NOTE — Progress Notes (Signed)
Patient ID: Renee Gomez, female   DOB: 12-04-1970, 52 y.o.   MRN: [id number]  HPI Renee Gomez is a 52 y.o. female seen in consultation at the request of Dr. Redmond School.  She reports a day history of rectal pain.  Mainly on the right buttocks worsening when sitting down.  Pain is sharp moderate and intermittent.She has not seen any drainage or bleeding.  Prior history of appendectomy and bilateral excisional biopsy of the breast for benign disease. Right hip arthroplasty  Is otherwise healthy and is able to perform more than 4 METS of activity without any shortness of breath chest pain  HPI  Past Medical History:  Diagnosis Date   History of kidney stones    Moderate episode of recurrent major depressive disorder (HCC) 03/10/2017    Past Surgical History:  Procedure Laterality Date   APPENDECTOMY     BREAST EXCISIONAL BIOPSY Bilateral 25+yrs ago   neg   BREAST SURGERY     COLONOSCOPY WITH PROPOFOL N/A 04/24/2019   Procedure: COLONOSCOPY WITH PROPOFOL;  Surgeon: Toney Reil, MD;  Location: [facility] ENDOSCOPY;  Service: Gastroenterology;  Laterality: N/A;   JOINT REPLACEMENT     LITHOTRIPSY  1995   MINOR BREAST BIOPSY Bilateral    neg x 2   TOTAL HIP ARTHROPLASTY Right 12/2012    Family History  Problem Relation Age of Onset   Cancer Mother        ovarian   Breast cancer Neg Hx     Social History Social History   Tobacco Use   Smoking status: Never    Passive exposure: Never   Smokeless tobacco: Never  Vaping Use   Vaping status: Never Used  Substance Use Topics   Alcohol use: No    Alcohol/week: 0.0 standard drinks of alcohol   Drug use: No    No Known Allergies  Current Outpatient Medications  Medication Sig Dispense Refill   Calcium Carbonate-Vitamin D (CALCIUM PLUS VITAMIN D PO) Take by mouth daily at 6 (six) AM.     ciprofloxacin (CIPRO) 500 MG tablet Take 1 tablet (500 mg total) by mouth 2 (two) times daily for 10 days. 20 tablet 0    glucosamine-chondroitin 500-400 MG tablet Take 1 tablet by mouth 3 (three) times daily.     metroNIDAZOLE (FLAGYL) 500 MG tablet Take 1 tablet (500 mg total) by mouth 3 (three) times daily for 10 days. 30 tablet 0   Plant Sterols and Stanols (CHOLESTOFF PO) Take by mouth.     No current facility-administered medications for this visit.     Review of Systems Full ROS  was asked and was negative except for the information on the HPI  Physical Exam Blood pressure 134/83, pulse 82, temperature 98 F (36.7 C), temperature source Oral, height 5\' 1"  (1.549 m), weight 118 lb 9.6 oz (53.8 kg), SpO2 98%. CONSTITUTIONAL: Non toxic, standing up as sitting down is very uncomfortable. EYES: Pupils are equal, round, and reactive to light, Sclera are non-icteric. EARS, NOSE, MOUTH AND THROAT: The oropharynx is clear. The oral mucosa is pink and moist. Hearing is intact to voice. LYMPH NODES:  Lymph nodes in the neck are normal. RESPIRATORY:  Lungs are clear. There is normal respiratory effort, with equal breath sounds bilaterally, and without pathologic use of accessory muscles. CARDIOVASCULAR: Heart is regular without murmurs, gallops, or rubs. GI: The abdomen is  soft, nontender, and nondistended. There are no palpable masses. There is no hepatosplenomegaly. There are normal bowel sounds  Rectal:  On the Right lateral aspect there is a  Large fluctuant tender area with overlying erythema and warmth.  No drainage or opening noted.  Does not appear to communicate with the rectum.  MUSCULOSKELETAL: Normal muscle strength and tone. No cyanosis or edema.   SKIN: Turgor is good and there are no pathologic skin lesions or ulcers. NEUROLOGIC: Motor and sensation is grossly normal. Cranial nerves are grossly intact. PSYCH:  Oriented to person, place and time. Affect is normal.  Data Reviewed  I have personally reviewed the patient's imaging, laboratory findings and medical records.     Assessment/Plan 52 year old female with perirectal abscess.  Discussed with the patient in detail about her disease process and the need for incision and drainage.  Options of performing I&D here in the office under local versus doing it in the operating room tomorrow were discussed with the patient in detail.  Risk, benefits and possible complications including but not limited to: Bleeding, infection injury to adjacent structures and chronic pain.  She wishes to proceed here in the office under local anesthetic.  She will also need a short course of antibiotic therapy given erythema surrounding the area. Copy of this report was sent to the referring provider Please note I spent 40 minutes in this encounter including personally reviewing medical records, coordinating her care, placing orders, counseling the patient and performing documentation  PROCEDURE NOTE DX:  Right perirectal abscess  Procedure: Incision and drainage right perirectal abscess Excisional debridement skin , soft tissue down to fascia 4cm2  Anesthesia: lidocaine 1% w epi 20cc  Findings: Right lateral perirectal abscess  Complications : none  After informed consent was obtained the patient was placed in a lateral decubitus position and appropriate exposure to the perirectal area was done by retracting the buttocks with tape.  She was prepped and draped in the usual sterile fashion and local anesthetic was used to infiltrate the area of interest.  Using a 15 blade knife the area of fluctuance was drained .  It was drained and culture.  Excisional debridement performed with knife.  Hemostasis obtained with pressure.  Wound was irrigated and packed with half-inch packing.  No complications  Sterling Big, MD FACS General Surgeon 12/31/2022, 7:41 AM

## 2023-01-13 ENCOUNTER — Ambulatory Visit (INDEPENDENT_AMBULATORY_CARE_PROVIDER_SITE_OTHER): Admitting: Surgery

## 2023-01-13 ENCOUNTER — Encounter: Payer: Self-pay | Admitting: Surgery

## 2023-01-13 VITALS — BP 122/86 | HR 74 | Temp 98.2°F | Ht 61.0 in | Wt 119.0 lb

## 2023-01-13 DIAGNOSIS — K611 Rectal abscess: Secondary | ICD-10-CM

## 2023-01-13 DIAGNOSIS — Z09 Encounter for follow-up examination after completed treatment for conditions other than malignant neoplasm: Secondary | ICD-10-CM

## 2023-01-13 NOTE — Patient Instructions (Addendum)
When you shower, remove your Band-Aid first. Wash as usual letting the soapy water run over the area, rinse well, and pat dry, then place a new Band-Aid.   Change your Band-Aid every day until the area closes and stops draining.  Call us for a follow up if you start to develop any fever, chills, spreading redness, or increased pain.     Follow-up with our office as needed.  Please call and ask to speak with a nurse if you develop questions or concerns.

## 2023-01-14 NOTE — Progress Notes (Signed)
[place] 52-year-old female following after I&D of perirectal abscess.  She is doing well.  Wound is healing well.  No fevers no chills still some discomfort.  PE: Chaperone present On The right buttocks there is a cavity measuring 1.1 x 1.1 cm is healing well with good granulation tissue.  There is minimal induration. No need for further packing replaced w large bandaid.  A/P doing very well without complications  RTC as needed

## 2023-04-08 NOTE — Progress Notes (Signed)
Date:  04/09/2023   Name:  Renee Gomez   DOB:  [birth year]   MRN:  [id number]   Chief Complaint: Annual Exam (Breast and Pap) [place] is a 52 y.o. female who presents today for her Complete Annual Exam. She feels well. She reports exercising. She reports she is sleeping well. Breast complaints - none. No menstrual issues - rare spotting only, no hot flashes or sweats.  Concerned about family hx of cerebral aneurysm - both parents, mother recently passed at age 71 after aneurysm surgery.  Mammogram: 08/2022 DEXA: none Colonoscopy: 04/2019 repeat 10 yrs Pap: 03/2019 neg/neg  Health Maintenance Due  Topic Date Due   HIV Screening  Never done   COVID-19 Vaccine (2 - Moderna risk series) 02/14/2020    Immunization History  Administered Date(s) Administered   Influenza Inj Mdck Quad With Preservative 03/26/2018   Influenza,inj,Quad PF,6+ Mos 03/15/2015, 03/10/2017, 03/14/2019   Influenza-Unspecified 03/08/2020, 03/08/2022, 03/03/2023   MODERNA COVID-19 SARS-COV-2 PEDS BIVALENT BOOSTER 6yr-55yr 01/17/2020   Tdap 08/28/2015   Zoster Recombinant(Shingrix) 04/06/2022, 07/07/2022     Gastroesophageal Reflux She complains of heartburn. She reports no chest pain. This is a recurrent problem. The problem occurs rarely. Pertinent negatives include no fatigue or weight loss.  Hyperlipidemia This is a chronic problem. Recent lipid tests were reviewed and are high. Pertinent negatives include no chest pain or shortness of breath. Current antihyperlipidemic treatment includes diet change and exercise.  Diabetes She presents for her follow-up diabetic visit. Diabetes type: prediabetes. Hypoglycemia symptoms include headaches and nervousness/anxiousness. Pertinent negatives for diabetes include no blurred vision, no chest pain, no fatigue, no foot paresthesias, no foot ulcerations, no visual change and no weight loss. Symptoms are stable.    Review of Systems  Constitutional:  Negative  for chills, fatigue, fever and weight loss.  Eyes:  Negative for blurred vision and visual disturbance.  Respiratory:  Negative for chest tightness and shortness of breath.   Cardiovascular:  Negative for chest pain, palpitations and leg swelling.  Gastrointestinal:  Positive for heartburn. Negative for abdominal distention, constipation and rectal pain.  Genitourinary:  Negative for frequency, hematuria and urgency.  Musculoskeletal:  Negative for arthralgias and gait problem.  Neurological:  Positive for headaches.  Psychiatric/Behavioral:  Positive for dysphoric mood. Negative for sleep disturbance and suicidal ideas. The patient is nervous/anxious.      Lab Results  Component Value Date   NA 139 04/06/2022   K 4.2 04/06/2022   CO2 23 04/06/2022   GLUCOSE 88 04/06/2022   BUN 9 04/06/2022   CREATININE 0.56 (L) 04/06/2022   CALCIUM 9.2 04/06/2022   EGFR 109 04/06/2022   GFRNONAA 110 04/02/2020   Lab Results  Component Value Date   CHOL 277 (H) 04/06/2022   HDL 52 04/06/2022   LDLCALC 194 (H) 04/06/2022   TRIG 166 (H) 04/06/2022   CHOLHDL 5.3 (H) 04/06/2022   Lab Results  Component Value Date   TSH 3.110 04/06/2022   Lab Results  Component Value Date   HGBA1C 5.9 (H) 04/06/2022   Lab Results  Component Value Date   WBC 6.0 04/06/2022   HGB 13.4 04/06/2022   HCT 40.0 04/06/2022   MCV 93 04/06/2022   PLT 251 04/06/2022   Lab Results  Component Value Date   ALT 13 04/06/2022   AST 18 04/06/2022   ALKPHOS 64 04/06/2022   BILITOT 0.3 04/06/2022   No results found for: "25OHVITD2", "25OHVITD3", "VD25OH"   Patient Active Problem  List   Diagnosis Date Noted   Prediabetes 04/09/2023   Family history of cerebral aneurysm 04/09/2023   Plantar wart of right foot 10/03/2019   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   Vertigo, intermittent 03/15/2018   Low back pain with sciatica 01/18/2015   Fibrocystic breast 01/18/2015   Acid reflux 01/18/2015    Hyperlipidemia, mild 01/18/2015   Knee pain, right 01/18/2015    No Known Allergies  Past Surgical History:  Procedure Laterality Date   APPENDECTOMY     BREAST EXCISIONAL BIOPSY Bilateral 25+yrs ago   neg   BREAST SURGERY     COLONOSCOPY WITH PROPOFOL N/A 04/24/2019   Procedure: COLONOSCOPY WITH PROPOFOL;  Surgeon: Toney Reil, MD;  Location: [facility] ENDOSCOPY;  Service: Gastroenterology;  Laterality: N/A;   LITHOTRIPSY  1995   MINOR BREAST BIOPSY Bilateral    neg x 2   TOTAL HIP ARTHROPLASTY Right 12/2012    Social History   Tobacco Use   Smoking status: Never    Passive exposure: Never   Smokeless tobacco: Never  Vaping Use   Vaping status: Never Used  Substance Use Topics   Alcohol use: No    Alcohol/week: 0.0 standard drinks of alcohol   Drug use: No     Medication list has been reviewed and updated.  Current Meds  Medication Sig   Calcium Carbonate-Vitamin D (CALCIUM PLUS VITAMIN D PO) Take by mouth daily at 6 (six) AM.   glucosamine-chondroitin 500-400 MG tablet Take 1 tablet by mouth 3 (three) times daily.   Plant Sterols and Stanols (CHOLESTOFF PO) Take by mouth.       04/09/2023   10:37 AM 12/30/2022    1:37 PM 11/13/2022    1:28 PM 04/06/2022   10:53 AM  GAD 7 : Generalized Anxiety Score  Nervous, Anxious, on Edge 1 1 1 1   Control/stop worrying 1 1 0 1  Worry too much - different things 1 1 2 2   Trouble relaxing 1 1 1 1   Restless 1 1 1  0  Easily annoyed or irritable 2 1 1 1   Afraid - awful might happen 2 1 1 1   Total GAD 7 Score 9 7 7 7   Anxiety Difficulty Very difficult Somewhat difficult Not difficult at all Somewhat difficult       04/09/2023   10:37 AM 12/30/2022    1:36 PM 11/13/2022    1:28 PM  Depression screen PHQ 2/9  Decreased Interest 1 1 0  Down, Depressed, Hopeless 2 1 0  PHQ - 2 Score 3 2 0  Altered sleeping 2 2 0  Tired, decreased energy 2 2 2   Change in appetite 1 1 1   Feeling bad or failure about yourself  2 1 0   Trouble concentrating 1 1 1   Moving slowly or fidgety/restless 1 1 0  Suicidal thoughts 1 1 0  PHQ-9 Score 13 11 4   Difficult doing work/chores Somewhat difficult Somewhat difficult Not difficult at all    BP Readings from Last 3 Encounters:  04/09/23 110/60  01/13/23 122/86  12/30/22 134/83    Physical Exam Vitals and nursing note reviewed.  Constitutional:      General: She is not in acute distress.    Appearance: She is well-developed.  HENT:     Head: Normocephalic and atraumatic.     Right Ear: Tympanic membrane and ear canal normal.     Left Ear: Tympanic membrane and ear canal normal.     Nose:  Right Sinus: No maxillary sinus tenderness.     Left Sinus: No maxillary sinus tenderness.  Eyes:     General: No scleral icterus.       Right eye: No discharge.        Left eye: No discharge.     Conjunctiva/sclera: Conjunctivae normal.  Neck:     Thyroid: No thyromegaly.     Vascular: No carotid bruit.  Cardiovascular:     Rate and Rhythm: Normal rate and regular rhythm.     Pulses: Normal pulses.     Heart sounds: Normal heart sounds.  Pulmonary:     Effort: Pulmonary effort is normal. No respiratory distress.     Breath sounds: No wheezing.  Chest:  Breasts:    Right: Tenderness present. No mass, nipple discharge or skin change.     Left: Tenderness present. No mass, nipple discharge or skin change.  Abdominal:     General: Bowel sounds are normal.     Palpations: Abdomen is soft.     Tenderness: There is no abdominal tenderness.  Genitourinary:    Labia:        Right: No tenderness, lesion or injury.        Left: No tenderness, lesion or injury.      Vagina: Normal.     Cervix: Normal.     Uterus: Normal.      Adnexa: Right adnexa normal and left adnexa normal.     Comments: Pap obtained Musculoskeletal:     Cervical back: Normal range of motion. No erythema.     Right lower leg: No edema.     Left lower leg: No edema.  Lymphadenopathy:      Cervical: No cervical adenopathy.  Skin:    General: Skin is warm and dry.     Findings: No rash.  Neurological:     Mental Status: She is alert and oriented to person, place, and time.     Cranial Nerves: No cranial nerve deficit.     Sensory: No sensory deficit.     Deep Tendon Reflexes: Reflexes are normal and symmetric.  Psychiatric:        Attention and Perception: Attention normal.        Mood and Affect: Mood normal.     Wt Readings from Last 3 Encounters:  04/09/23 122 lb (55.3 kg)  01/13/23 119 lb (54 kg)  12/30/22 118 lb 9.6 oz (53.8 kg)    BP 110/60   Pulse 72   Ht 5\' 1"  (1.549 m)   Wt 122 lb (55.3 kg)   SpO2 98%   BMI 23.05 kg/m   Assessment and Plan:  Problem List Items Addressed This Visit       Unprioritized   Hyperlipidemia, mild (Chronic)    Cholesterol managed with diet and exercise. May want to start statin therapy due to family hx of stroke. Lab Results  Component Value Date   LDLCALC 194 (H) 04/06/2022         Relevant Orders   Lipid panel   Prediabetes    Blood sugars controlled with diet and exercise. Lab Results  Component Value Date   HGBA1C 5.9 (H) 04/06/2022         Relevant Orders   Comprehensive metabolic panel   Hemoglobin A1c   Family history of cerebral aneurysm   Relevant Orders   MR Angiogram Head Wo Contrast   Other Visit Diagnoses     Annual physical exam    -  Primary   Relevant Orders   CBC with Differential/Platelet   Comprehensive metabolic panel   Cytology - PAP   Lipid panel   TSH   Encounter for screening mammogram for breast cancer       Relevant Orders   MM 3D SCREENING MAMMOGRAM BILATERAL BREAST   Encounter for screening for cervical cancer       Relevant Orders   Cytology - PAP       Return in about 6 months (around 10/07/2023) for Lipids, pre-DM.    Reubin Milan, MD [facility] [place]

## 2023-04-09 ENCOUNTER — Ambulatory Visit (INDEPENDENT_AMBULATORY_CARE_PROVIDER_SITE_OTHER): Admitting: Internal Medicine

## 2023-04-09 ENCOUNTER — Other Ambulatory Visit (HOSPITAL_COMMUNITY)
Admission: RE | Admit: 2023-04-09 | Discharge: 2023-04-09 | Disposition: A | Discharge status: Home / Self Care | Source: Ambulatory Visit | Attending: Internal Medicine | Admitting: Internal Medicine

## 2023-04-09 ENCOUNTER — Encounter: Payer: Self-pay | Admitting: Internal Medicine

## 2023-04-09 VITALS — BP 110/60 | HR 72 | Ht 61.0 in | Wt 122.0 lb

## 2023-04-09 DIAGNOSIS — Z124 Encounter for screening for malignant neoplasm of cervix: Secondary | ICD-10-CM | POA: Insufficient documentation

## 2023-04-09 DIAGNOSIS — E785 Hyperlipidemia, unspecified: Secondary | ICD-10-CM | POA: Diagnosis not present

## 2023-04-09 DIAGNOSIS — Z1231 Encounter for screening mammogram for malignant neoplasm of breast: Secondary | ICD-10-CM | POA: Diagnosis not present

## 2023-04-09 DIAGNOSIS — Z8249 Family history of ischemic heart disease and other diseases of the circulatory system: Secondary | ICD-10-CM | POA: Insufficient documentation

## 2023-04-09 DIAGNOSIS — Z0000 Encounter for general adult medical examination without abnormal findings: Secondary | ICD-10-CM

## 2023-04-09 DIAGNOSIS — R7303 Prediabetes: Secondary | ICD-10-CM | POA: Insufficient documentation

## 2023-04-09 NOTE — Assessment & Plan Note (Signed)
Blood sugars controlled with diet and exercise. Lab Results  Component Value Date   HGBA1C 5.9 (H) 04/06/2022

## 2023-04-09 NOTE — Assessment & Plan Note (Addendum)
Cholesterol managed with diet and exercise. May want to start statin therapy due to family hx of stroke. Lab Results  Component Value Date   LDLCALC 194 (H) 04/06/2022

## 2023-04-09 NOTE — Patient Instructions (Addendum)
Call [facility] to schedule your mammogram at [phone].  I have ordered an MR Angiogram to evaluate for cerebral aneurysm.

## 2023-04-10 LAB — CBC WITH DIFFERENTIAL/PLATELET
Basophils Absolute: 0 10*3/uL (ref 0.0–0.2)
Basos: 1 %
EOS (ABSOLUTE): 0.1 10*3/uL (ref 0.0–0.4)
Eos: 2 %
Hematocrit: 42.2 % (ref 34.0–46.6)
Hemoglobin: 13.6 g/dL (ref 11.1–15.9)
Immature Grans (Abs): 0 10*3/uL (ref 0.0–0.1)
Immature Granulocytes: 0 %
Lymphocytes Absolute: 2.4 10*3/uL (ref 0.7–3.1)
Lymphs: 38 %
MCH: 30.6 pg (ref 26.6–33.0)
MCHC: 32.2 g/dL (ref 31.5–35.7)
MCV: 95 fL (ref 79–97)
Monocytes Absolute: 0.4 10*3/uL (ref 0.1–0.9)
Monocytes: 7 %
Neutrophils Absolute: 3.2 10*3/uL (ref 1.4–7.0)
Neutrophils: 52 %
Platelets: 240 10*3/uL (ref 150–450)
RBC: 4.44 x10E6/uL (ref 3.77–5.28)
RDW: 12.2 % (ref 11.7–15.4)
WBC: 6.2 10*3/uL (ref 3.4–10.8)

## 2023-04-10 LAB — LIPID PANEL
Chol/HDL Ratio: 4.8 ratio — ABNORMAL HIGH (ref 0.0–4.4)
Cholesterol, Total: 274 mg/dL — ABNORMAL HIGH (ref 100–199)
HDL: 57 mg/dL (ref >39)
LDL Chol Calc (NIH): 192 mg/dL — ABNORMAL HIGH (ref 0–99)
Triglycerides: 136 mg/dL (ref 0–149)
VLDL Cholesterol Cal: 25 mg/dL (ref 5–40)

## 2023-04-10 LAB — COMPREHENSIVE METABOLIC PANEL
ALT: 12 [IU]/L (ref 0–32)
AST: 19 [IU]/L (ref 0–40)
Albumin: 4.4 g/dL (ref 3.8–4.9)
Alkaline Phosphatase: 72 [IU]/L (ref 44–121)
BUN/Creatinine Ratio: 22 (ref 9–23)
BUN: 12 mg/dL (ref 6–24)
Bilirubin Total: 0.4 mg/dL (ref 0.0–1.2)
CO2: 26 mmol/L (ref 20–29)
Calcium: 9.6 mg/dL (ref 8.7–10.2)
Chloride: 105 mmol/L (ref 96–106)
Creatinine, Ser: 0.55 mg/dL — ABNORMAL LOW (ref 0.57–1.00)
Globulin, Total: 2.5 g/dL (ref 1.5–4.5)
Glucose: 87 mg/dL (ref 70–99)
Potassium: 3.8 mmol/L (ref 3.5–5.2)
Sodium: 141 mmol/L (ref 134–144)
Total Protein: 6.9 g/dL (ref 6.0–8.5)
eGFR: 109 mL/min/{1.73_m2} (ref >59)

## 2023-04-10 LAB — HEMOGLOBIN A1C
Est. average glucose Bld gHb Est-mCnc: 123 mg/dL
Hgb A1c MFr Bld: 5.9 % — ABNORMAL HIGH (ref 4.8–5.6)

## 2023-04-10 LAB — TSH: TSH: 2.22 u[IU]/mL (ref 0.450–4.500)

## 2023-04-12 ENCOUNTER — Other Ambulatory Visit: Payer: Self-pay | Admitting: Internal Medicine

## 2023-04-12 DIAGNOSIS — E785 Hyperlipidemia, unspecified: Secondary | ICD-10-CM

## 2023-04-12 MED ORDER — ROSUVASTATIN CALCIUM 5 MG PO TABS: 5.0000 mg | ORAL_TABLET | Freq: Every day | ORAL | 0 refills | Status: DC

## 2023-04-12 NOTE — Progress Notes (Signed)
Pt called. Pt aware. Pt would like medication sent to [facility] [address] [place].  KP

## 2023-04-12 NOTE — Progress Notes (Signed)
Please schedule follow up appt.  KP

## 2023-04-14 ENCOUNTER — Ambulatory Visit
Admission: RE | Admit: 2023-04-14 | Discharge: 2023-04-14 | Disposition: A | Discharge status: Home / Self Care | Source: Ambulatory Visit | Attending: Internal Medicine | Admitting: Internal Medicine

## 2023-04-14 DIAGNOSIS — Z8249 Family history of ischemic heart disease and other diseases of the circulatory system: Secondary | ICD-10-CM | POA: Diagnosis not present

## 2023-04-14 DIAGNOSIS — Z1389 Encounter for screening for other disorder: Secondary | ICD-10-CM | POA: Insufficient documentation

## 2023-04-14 LAB — CYTOLOGY - PAP
Comment: NEGATIVE
Diagnosis: NEGATIVE
High risk HPV: NEGATIVE

## 2023-04-16 ENCOUNTER — Ambulatory Visit: Payer: Self-pay

## 2023-04-16 NOTE — Telephone Encounter (Signed)
  Chief Complaint: Lab results Symptoms:  Frequency:  Pertinent Negatives: Patient denies  Disposition: [] ED /[] Urgent Care (no appt availability in office) / [] Appointment(In office/virtual)/ []  [place] [facility]/ [] Home Care/ [] Refused Recommended Disposition /[]  Mobile Bus/ [x]  Follow-up with PCP Additional Notes: Returned pt's call. Shared providers' note. Pt had already gotten other lab results and only needed pap results. MRI results not yet available. No further questions.  Reubin Milan, MD 04/14/2023  4:23 PM EST     Pap is normal with negative HPV.  Repeat in 5 yrs for screening.       Reason for Disposition  Health Information question, no triage required and triager able to answer question  Answer Assessment - Initial Assessment Questions 1. REASON FOR CALL or QUESTION: "What is your reason for calling today?" or "How can I best help you?" or "What question do you have that I can help answer?"     Lab results.  Protocols used: Information Only Call - No Triage-A-AH

## 2023-04-23 ENCOUNTER — Telehealth: Payer: Self-pay | Admitting: Internal Medicine

## 2023-04-23 NOTE — Telephone Encounter (Signed)
Pt aware and verbalized understanding.  KP 

## 2023-04-23 NOTE — Telephone Encounter (Signed)
Patient's huaband, Lorella Nimrod, has called in regards to results for patient's MRI she had done at the first of November, they have not heard back for these results and also patient's husband was questioning about PAP results. Patient's husband states she wanted clarification on PAP smear results but there is an NT encounter from 04/16/2023 showing where these results were given to patient. Patient's husband is not with the patient. Please advise and contact patient back for MRI Results at phone #[phone] (Patient's husband number, Lorella Nimrod).

## 2023-05-03 NOTE — Telephone Encounter (Signed)
Called the reading room and asked them to read this for the patient. Will call patient and inform we should have result sin next few days.

## 2023-05-03 NOTE — Telephone Encounter (Signed)
Pt's husband called for update on MRI results, please advise

## 2023-07-15 ENCOUNTER — Other Ambulatory Visit: Payer: Self-pay | Admitting: Internal Medicine

## 2023-07-15 DIAGNOSIS — E785 Hyperlipidemia, unspecified: Secondary | ICD-10-CM

## 2023-07-16 NOTE — Telephone Encounter (Signed)
 Requested Prescriptions  Pending Prescriptions Disp Refills   rosuvastatin  (CRESTOR ) 5 MG tablet [Pharmacy Med Name: ROSUVASTATIN  5MG  TABLETS] 90 tablet 1    Sig: TAKE 1 TABLET(5 MG) BY MOUTH DAILY     Cardiovascular:  Antilipid - Statins 2 Failed - 07/16/2023 11:36 AM      Failed - Cr in normal range and within 360 days    Creatinine, Ser  Date Value Ref Range Status  04/09/2023 0.55 (L) 0.57 - 1.00 mg/dL Final         Failed - Lipid Panel in normal range within the last 12 months    Cholesterol, Total  Date Value Ref Range Status  04/09/2023 274 (H) 100 - 199 mg/dL Final   LDL Chol Calc (NIH)  Date Value Ref Range Status  04/09/2023 192 (H) 0 - 99 mg/dL Final   HDL  Date Value Ref Range Status  04/09/2023 57 >39 mg/dL Final   Triglycerides  Date Value Ref Range Status  04/09/2023 136 0 - 149 mg/dL Final         Passed - Patient is not pregnant      Passed - Valid encounter within last 12 months    Recent Outpatient Visits           3 months ago Annual physical exam   [place] [facility] at [facility], Leita DEL, MD   6 months ago Peri-rectal abscess   [facility] at [facility], Leita DEL, MD   8 months ago Pharyngitis, unspecified etiology   [place] [facility] at [facility], Leita DEL, MD   1 year ago Annual physical exam   [facility] at [facility], Leita DEL, MD   2 years ago Annual physical exam   [facility] at [facility], Leita DEL, MD       Future Appointments             In 2 months Just[place], Leita DEL, MD [facility] at [facility], [facility]

## 2023-08-18 ENCOUNTER — Ambulatory Visit
Admission: RE | Admit: 2023-08-18 | Discharge: 2023-08-18 | Disposition: A | Discharge status: Home / Self Care | Source: Ambulatory Visit | Attending: Internal Medicine | Admitting: Internal Medicine

## 2023-08-18 DIAGNOSIS — Z1231 Encounter for screening mammogram for malignant neoplasm of breast: Secondary | ICD-10-CM | POA: Insufficient documentation

## 2023-10-07 ENCOUNTER — Encounter: Payer: Self-pay | Admitting: Internal Medicine

## 2023-10-07 ENCOUNTER — Ambulatory Visit: Payer: Self-pay | Admitting: Internal Medicine

## 2023-10-07 VITALS — BP 114/72 | HR 81 | Ht 61.0 in | Wt 121.0 lb

## 2023-10-07 DIAGNOSIS — R7303 Prediabetes: Secondary | ICD-10-CM | POA: Diagnosis not present

## 2023-10-07 DIAGNOSIS — E785 Hyperlipidemia, unspecified: Secondary | ICD-10-CM | POA: Diagnosis not present

## 2023-10-07 NOTE — Assessment & Plan Note (Signed)
 Stable A1C - continue low carb diet choices Will continue to monitor annually

## 2023-10-07 NOTE — Assessment & Plan Note (Addendum)
 LDL was Lab Results  Component Value Date   LDLCALC 192 (H) 04/09/2023   Current regimen is Crestor  started after last visit.  No medication side effects noted. Goal LDL is <70. Check labs and advise on dose adjustment.

## 2023-10-07 NOTE — Progress Notes (Signed)
 Date:  10/07/2023   Name:  Renee Gomez   DOB:  09/23/1970   MRN:  [id number]   Chief Complaint: Prediabetes and Hyperlipidemia  Hyperlipidemia This is a chronic problem. Pertinent negatives include no chest pain, myalgias or shortness of breath. Current antihyperlipidemic treatment includes statins. There are no compliance problems.  Risk factors for coronary artery disease include family history.    Review of Systems  Constitutional:  Negative for chills, fatigue and unexpected weight change.  HENT:  Negative for trouble swallowing.   Eyes:  Negative for visual disturbance.  Respiratory:  Negative for cough, chest tightness, shortness of breath and wheezing.   Cardiovascular:  Negative for chest pain, palpitations and leg swelling.  Gastrointestinal:  Negative for abdominal pain, constipation and diarrhea.  Musculoskeletal:  Negative for arthralgias and myalgias.  Neurological:  Negative for dizziness, weakness, light-headedness and headaches.  Psychiatric/Behavioral:  Negative for dysphoric mood and sleep disturbance. The patient is not nervous/anxious.      Lab Results  Component Value Date   NA 141 04/09/2023   K 3.8 04/09/2023   CO2 26 04/09/2023   GLUCOSE 87 04/09/2023   BUN 12 04/09/2023   CREATININE 0.55 (L) 04/09/2023   CALCIUM  9.6 04/09/2023   EGFR 109 04/09/2023   GFRNONAA 110 04/02/2020   Lab Results  Component Value Date   CHOL 274 (H) 04/09/2023   HDL 57 04/09/2023   LDLCALC 192 (H) 04/09/2023   TRIG 136 04/09/2023   CHOLHDL 4.8 (H) 04/09/2023   Lab Results  Component Value Date   TSH 2.220 04/09/2023   Lab Results  Component Value Date   HGBA1C 5.9 (H) 04/09/2023   Lab Results  Component Value Date   WBC 6.2 04/09/2023   HGB 13.6 04/09/2023   HCT 42.2 04/09/2023   MCV 95 04/09/2023   PLT 240 04/09/2023   Lab Results  Component Value Date   ALT 12 04/09/2023   AST 19 04/09/2023   ALKPHOS 72 04/09/2023   BILITOT 0.4 04/09/2023   No  results found for: "25OHVITD2", "25OHVITD3", "VD25OH"   Patient Active Problem List   Diagnosis Date Noted   Prediabetes 04/09/2023   Family history of cerebral aneurysm 04/09/2023   Plantar wart of right foot 10/03/2019   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   Vertigo, intermittent 03/15/2018   Low back pain with sciatica 01/18/2015   Fibrocystic breast 01/18/2015   Acid reflux 01/18/2015   Hyperlipidemia, mild 01/18/2015   Knee pain, right 01/18/2015    No Known Allergies  Past Surgical History:  Procedure Laterality Date   APPENDECTOMY     BREAST EXCISIONAL BIOPSY Bilateral 25+yrs ago   neg   BREAST SURGERY     COLONOSCOPY WITH PROPOFOL  N/A 04/24/2019   Procedure: COLONOSCOPY WITH PROPOFOL ;  Surgeon: Selena Daily, MD;  Location: [facility] ENDOSCOPY;  Service: Gastroenterology;  Laterality: N/A;   LITHOTRIPSY  1995   MINOR BREAST BIOPSY Bilateral    neg x 2   TOTAL HIP ARTHROPLASTY Right 12/2012    Social History   Tobacco Use   Smoking status: Never    Passive exposure: Never   Smokeless tobacco: Never  Vaping Use   Vaping status: Never Used  Substance Use Topics   Alcohol use: No    Alcohol/week: 0.0 standard drinks of alcohol   Drug use: No     Medication list has been reviewed and updated.  Current Meds  Medication Sig   Calcium  Carbonate-Vitamin D (  CALCIUM  PLUS VITAMIN D PO) Take by mouth daily at 6 (six) AM.   glucosamine-chondroitin 500-400 MG tablet Take 1 tablet by mouth 3 (three) times daily.   Plant Sterols and Stanols (CHOLESTOFF PO) Take by mouth.   rosuvastatin  (CRESTOR ) 5 MG tablet TAKE 1 TABLET(5 MG) BY MOUTH DAILY       04/09/2023   10:37 AM 12/30/2022    1:37 PM 11/13/2022    1:28 PM 04/06/2022   10:53 AM  GAD 7 : Generalized Anxiety Score  Nervous, Anxious, on Edge 1 1 1 1   Control/stop worrying 1 1 0 1  Worry too much - different things 1 1 2 2   Trouble relaxing 1 1 1 1   Restless 1 1 1  0  Easily annoyed or irritable  2 1 1 1   Afraid - awful might happen 2 1 1 1   Total GAD 7 Score 9 7 7 7   Anxiety Difficulty Very difficult Somewhat difficult Not difficult at all Somewhat difficult       04/09/2023   10:37 AM 12/30/2022    1:36 PM 11/13/2022    1:28 PM  Depression screen PHQ 2/9  Decreased Interest 1 1 0  Down, Depressed, Hopeless 2 1 0  PHQ - 2 Score 3 2 0  Altered sleeping 2 2 0  Tired, decreased energy 2 2 2   Change in appetite 1 1 1   Feeling bad or failure about yourself  2 1 0  Trouble concentrating 1 1 1   Moving slowly or fidgety/restless 1 1 0  Suicidal thoughts 1 1 0  PHQ-9 Score 13 11 4   Difficult doing work/chores Somewhat difficult Somewhat difficult Not difficult at all    BP Readings from Last 3 Encounters:  10/07/23 114/72  04/09/23 110/60  01/13/23 122/86    Physical Exam Vitals and nursing note reviewed.  Constitutional:      General: She is not in acute distress.    Appearance: She is well-developed.  HENT:     Head: Normocephalic and atraumatic.  Neck:     Vascular: No carotid bruit.  Cardiovascular:     Rate and Rhythm: Normal rate and regular rhythm.  Pulmonary:     Effort: Pulmonary effort is normal. No respiratory distress.     Breath sounds: No wheezing or rhonchi.  Musculoskeletal:     Cervical back: Normal range of motion.     Right lower leg: No edema.     Left lower leg: No edema.  Lymphadenopathy:     Cervical: No cervical adenopathy.  Skin:    General: Skin is warm and dry.     Findings: No rash.  Neurological:     General: No focal deficit present.     Mental Status: She is alert and oriented to person, place, and time.  Psychiatric:        Mood and Affect: Mood normal.        Behavior: Behavior normal.     Wt Readings from Last 3 Encounters:  10/07/23 121 lb (54.9 kg)  04/09/23 122 lb (55.3 kg)  01/13/23 119 lb (54 kg)    BP 114/72   Pulse 81   Ht 5\' 1"  (1.549 m)   Wt 121 lb (54.9 kg)   SpO2 96%   BMI 22.86 kg/m   Assessment  and Plan:  Problem List Items Addressed This Visit       Unprioritized   Hyperlipidemia, mild - Primary (Chronic)   LDL was Lab Results  Component Value  Date   LDLCALC 192 (H) 04/09/2023   Current regimen is Crestor  started after last visit.  No medication side effects noted. Goal LDL is <70. Check labs and advise on dose adjustment.       Relevant Orders   Comprehensive metabolic panel with GFR   Lipid panel   Prediabetes   Stable A1C - continue low carb diet choices Will continue to monitor annually       Return for CPX.    Sheron Dixons, MD [facility] [place]

## 2023-10-08 ENCOUNTER — Encounter: Payer: Self-pay | Admitting: Internal Medicine

## 2023-10-08 ENCOUNTER — Other Ambulatory Visit: Payer: Self-pay | Admitting: Internal Medicine

## 2023-10-08 DIAGNOSIS — E785 Hyperlipidemia, unspecified: Secondary | ICD-10-CM

## 2023-10-08 LAB — COMPREHENSIVE METABOLIC PANEL WITH GFR
ALT: 14 IU/L (ref 0–32)
AST: 20 IU/L (ref 0–40)
Albumin: 4.4 g/dL (ref 3.8–4.9)
Alkaline Phosphatase: 68 IU/L (ref 44–121)
BUN/Creatinine Ratio: 22 (ref 9–23)
BUN: 14 mg/dL (ref 6–24)
Bilirubin Total: 0.4 mg/dL (ref 0.0–1.2)
CO2: 25 mmol/L (ref 20–29)
Calcium: 9.4 mg/dL (ref 8.7–10.2)
Chloride: 105 mmol/L (ref 96–106)
Creatinine, Ser: 0.63 mg/dL (ref 0.57–1.00)
Globulin, Total: 2.7 g/dL (ref 1.5–4.5)
Glucose: 88 mg/dL (ref 70–99)
Potassium: 4.2 mmol/L (ref 3.5–5.2)
Sodium: 142 mmol/L (ref 134–144)
Total Protein: 7.1 g/dL (ref 6.0–8.5)
eGFR: 105 mL/min/{1.73_m2} (ref >59)

## 2023-10-08 LAB — LIPID PANEL
Chol/HDL Ratio: 3.7 ratio (ref 0.0–4.4)
Cholesterol, Total: 221 mg/dL — ABNORMAL HIGH (ref 100–199)
HDL: 60 mg/dL (ref >39)
LDL Chol Calc (NIH): 145 mg/dL — ABNORMAL HIGH (ref 0–99)
Triglycerides: 91 mg/dL (ref 0–149)
VLDL Cholesterol Cal: 16 mg/dL (ref 5–40)

## 2023-10-08 MED ORDER — ROSUVASTATIN CALCIUM 10 MG PO TABS: 10.0000 mg | ORAL_TABLET | Freq: Every day | ORAL | 0 refills | Status: DC

## 2023-11-09 ENCOUNTER — Ambulatory Visit: Payer: Self-pay

## 2023-11-09 NOTE — Telephone Encounter (Signed)
 Copied from CRM [id number]. Topic: Clinical - Red Word Triage >> Nov 09, 2023 11:19 AM Turkey B wrote: [facility] that prompted transfer to Nurse Triage: pt eyes are swollen/puffy and dry   Chief Complaint: puffy eyes Symptoms: eyes puffy and itchy, blurry vision Frequency: constant Pertinent Negatives: Patient denies fever Disposition: [] ED /[] Urgent Care (no appt availability in office) / [x] Appointment(In office/virtual)/ []  [place] [facility]/ [] Home Care/ [] Refused Recommended Disposition /[] [facility] [facility]/ []  Follow-up with PCP Additional Notes: per husband patient's eyes have been itchy and puffy for over a week now, unrelieved with OTC allergy medication. Unsure of what the cause is. This RN offered appt time for today, but husband says that tomorrow would be better for patient. Appt scheduled for 6/4 at 11:20a  Reason for Disposition  [1] MILD eyelid swelling (puffiness) AND [2] persists > 3 days  (Exception: Suspect mosquito bites.)  Answer Assessment - Initial Assessment Questions 1. ONSET: "When did the swelling start?" (e.g., minutes, hours, days)     Started over a week ago  2. LOCATION: "What part of the eyelids is swollen?"     Bottom of eyelids are swollen  3. SEVERITY: "How swollen is it?"     Mild swelling, looks puffy  4. ITCHING: "Is there any itching?" If Yes, ask: "How much?"   (Scale 1-10; mild, moderate or severe)     Mild to moderrate itching  5. PAIN: "Is the swelling painful to touch?" If Yes, ask: "How painful is it?"   (Scale 1-10; mild, moderate or severe)     Mild pain, uncomfortable to see  6. FEVER: "Do you have a fever?" If Yes, ask: "What is it, how was it measured, and when did it start?"      No  7. CAUSE: "What do you think is causing the swelling?"     Unsure of cause, has tried a few things to help with allergy  8. RECURRENT SYMPTOM: "Have you had eyelid swelling before?" If Yes, ask: "When was the last time?" "What happened that  time?"     No  9. OTHER SYMPTOMS: "Do you have any other symptoms?" (e.g., blurred vision, eye discharge, rash, runny nose)     Blurry vision  10. PREGNANCY: "Is there any chance you are pregnant?" "When was your last menstrual period?"       no  Protocols used: Eye - Swelling-A-AH

## 2023-11-09 NOTE — Telephone Encounter (Signed)
 Noted  Pt has a appt.  KP

## 2023-11-10 ENCOUNTER — Encounter: Payer: Self-pay | Admitting: Internal Medicine

## 2023-11-10 ENCOUNTER — Ambulatory Visit (INDEPENDENT_AMBULATORY_CARE_PROVIDER_SITE_OTHER): Admitting: Internal Medicine

## 2023-11-10 VITALS — BP 116/70 | HR 75 | Ht 61.0 in | Wt 122.5 lb

## 2023-11-10 DIAGNOSIS — H5713 Ocular pain, bilateral: Secondary | ICD-10-CM

## 2023-11-10 MED ORDER — NEOMYCIN-POLYMYXIN-DEXAMETH 3.5-10000-0.1 OP SUSP: 2.0000 [drp] | Freq: Four times a day (QID) | OPHTHALMIC | 0 refills | Status: DC

## 2023-11-10 NOTE — Progress Notes (Signed)
 Date:  11/10/2023   Name:  Renee Gomez   DOB:  29-Mar-1971   MRN:  [id number]   Chief Complaint: Eye Burn (Patient said she is having some burning in both her eyes, can not open her eyes much, has tired eye drops, no relief. X 1 month)  Eye Problem  Both eyes are affected. This is a new problem. The current episode started 1 to 4 weeks ago. The problem occurs constantly. The problem has been unchanged. There was no injury mechanism. Associated symptoms include itching. Pertinent negatives include no eye discharge or eye redness.  Eyes are very painful when open, feel very irritated and tear often.  No known injury.  Tried moisturizing drops.  No discharge or matted eyes in the AM.  Does not wear contacts.   Review of Systems  Constitutional:  Negative for chills and fatigue.  Eyes:  Positive for pain and itching. Negative for discharge, redness and visual disturbance.  Respiratory:  Negative for chest tightness and shortness of breath.   Cardiovascular:  Negative for chest pain.     Lab Results  Component Value Date   NA 142 10/07/2023   K 4.2 10/07/2023   CO2 25 10/07/2023   GLUCOSE 88 10/07/2023   BUN 14 10/07/2023   CREATININE 0.63 10/07/2023   CALCIUM  9.4 10/07/2023   EGFR 105 10/07/2023   GFRNONAA 110 04/02/2020   Lab Results  Component Value Date   CHOL 221 (H) 10/07/2023   HDL 60 10/07/2023   LDLCALC 145 (H) 10/07/2023   TRIG 91 10/07/2023   CHOLHDL 3.7 10/07/2023   Lab Results  Component Value Date   TSH 2.220 04/09/2023   Lab Results  Component Value Date   HGBA1C 5.9 (H) 04/09/2023   Lab Results  Component Value Date   WBC 6.2 04/09/2023   HGB 13.6 04/09/2023   HCT 42.2 04/09/2023   MCV 95 04/09/2023   PLT 240 04/09/2023   Lab Results  Component Value Date   ALT 14 10/07/2023   AST 20 10/07/2023   ALKPHOS 68 10/07/2023   BILITOT 0.4 10/07/2023   No results found for: "25OHVITD2", "25OHVITD3", "VD25OH"   Patient Active Problem List    Diagnosis Date Noted   Prediabetes 04/09/2023   Family history of cerebral aneurysm 04/09/2023   Plantar wart of right foot 10/03/2019   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   Vertigo, intermittent 03/15/2018   Low back pain with sciatica 01/18/2015   Fibrocystic breast 01/18/2015   Acid reflux 01/18/2015   Hyperlipidemia, mild 01/18/2015   Knee pain, right 01/18/2015    No Known Allergies  Past Surgical History:  Procedure Laterality Date   APPENDECTOMY     BREAST EXCISIONAL BIOPSY Bilateral 25+yrs ago   neg   BREAST SURGERY     COLONOSCOPY WITH PROPOFOL  N/A 04/24/2019   Procedure: COLONOSCOPY WITH PROPOFOL ;  Surgeon: Selena Daily, MD;  Location: [facility] ENDOSCOPY;  Service: Gastroenterology;  Laterality: N/A;   LITHOTRIPSY  1995   MINOR BREAST BIOPSY Bilateral    neg x 2   TOTAL HIP ARTHROPLASTY Right 12/2012    Social History   Tobacco Use   Smoking status: Never    Passive exposure: Never   Smokeless tobacco: Never  Vaping Use   Vaping status: Never Used  Substance Use Topics   Alcohol use: No    Alcohol/week: 0.0 standard drinks of alcohol   Drug use: No     Medication list has  been reviewed and updated.  Current Meds  Medication Sig   Calcium  Carbonate-Vitamin D (CALCIUM  PLUS VITAMIN D PO) Take by mouth daily at 6 (six) AM.   glucosamine-chondroitin 500-400 MG tablet Take 1 tablet by mouth 3 (three) times daily.   neomycin -polymyxin b-dexamethasone (MAXITROL) 3.5-10000-0.1 SUSP Place 2 drops into both eyes every 6 (six) hours.   rosuvastatin  (CRESTOR ) 10 MG tablet Take 1 tablet (10 mg total) by mouth daily.       11/10/2023   11:22 AM 10/07/2023   11:02 AM 04/09/2023   10:37 AM 12/30/2022    1:37 PM  GAD 7 : Generalized Anxiety Score  Nervous, Anxious, on Edge 1 1 1 1   Control/stop worrying 1 1 1 1   Worry too much - different things 1 1 1 1   Trouble relaxing 1 1 1 1   Restless 1 1 1 1   Easily annoyed or irritable 1 1 2 1   Afraid -  awful might happen 1 1 2 1   Total GAD 7 Score 7 7 9 7   Anxiety Difficulty Somewhat difficult Somewhat difficult Very difficult Somewhat difficult       11/10/2023   11:22 AM 10/07/2023   11:02 AM 04/09/2023   10:37 AM  Depression screen PHQ 2/9  Decreased Interest 0 0 1  Down, Depressed, Hopeless 1 1 2   PHQ - 2 Score 1 1 3   Altered sleeping 2 2 2   Tired, decreased energy 2 2 2   Change in appetite 1 1 1   Feeling bad or failure about yourself  1 1 2   Trouble concentrating 1 1 1   Moving slowly or fidgety/restless 0 0 1  Suicidal thoughts 0 0 1  PHQ-9 Score 8 8 13   Difficult doing work/chores Not difficult at all Somewhat difficult Somewhat difficult    BP Readings from Last 3 Encounters:  11/10/23 116/70  10/07/23 114/72  04/09/23 110/60    Physical Exam Constitutional:      General: She is in acute distress.  Eyes:     General: Lids are normal. Vision grossly intact.     Extraocular Movements: Extraocular movements intact.     Conjunctiva/sclera: Conjunctivae normal.     Comments: Mild increase in tears  Cardiovascular:     Rate and Rhythm: Normal rate and regular rhythm.  Pulmonary:     Effort: Pulmonary effort is normal.  Musculoskeletal:     Cervical back: Normal range of motion.  Lymphadenopathy:     Cervical: No cervical adenopathy.  Neurological:     Mental Status: She is alert.     Wt Readings from Last 3 Encounters:  11/10/23 122 lb 8 oz (55.6 kg)  10/07/23 121 lb (54.9 kg)  04/09/23 122 lb (55.3 kg)    BP 116/70   Pulse 75   Ht 5\' 1"  (1.549 m)   Wt 122 lb 8 oz (55.6 kg)   SpO2 99%   BMI 23.15 kg/m   Assessment and Plan:  Problem List Items Addressed This Visit   None Visit Diagnoses       Eye pain, bilateral    -  Primary   unknown cause - would like her to see ophthalmology urgents eye drops given to use until seen   Relevant Medications   neomycin -polymyxin b-dexamethasone (MAXITROL) 3.5-10000-0.1 SUSP   Other Relevant Orders    Ambulatory referral to Ophthalmology       No follow-ups on file.    Sheron Dixons, MD [facility] [place]

## 2023-11-15 ENCOUNTER — Ambulatory Visit (INDEPENDENT_AMBULATORY_CARE_PROVIDER_SITE_OTHER): Admitting: Internal Medicine

## 2023-11-15 ENCOUNTER — Ambulatory Visit: Payer: Self-pay | Admitting: Internal Medicine

## 2023-11-15 ENCOUNTER — Encounter: Payer: Self-pay | Admitting: Internal Medicine

## 2023-11-15 ENCOUNTER — Ambulatory Visit
Admission: RE | Admit: 2023-11-15 | Discharge: 2023-11-15 | Disposition: A | Discharge status: Home / Self Care | Attending: Internal Medicine | Admitting: Internal Medicine

## 2023-11-15 ENCOUNTER — Ambulatory Visit
Admission: RE | Admit: 2023-11-15 | Discharge: 2023-11-15 | Disposition: A | Discharge status: Home / Self Care | Source: Ambulatory Visit | Attending: Internal Medicine | Admitting: Internal Medicine

## 2023-11-15 VITALS — BP 114/72 | HR 75 | Ht 61.0 in | Wt 122.4 lb

## 2023-11-15 DIAGNOSIS — M25521 Pain in right elbow: Secondary | ICD-10-CM | POA: Diagnosis not present

## 2023-11-15 DIAGNOSIS — M545 Low back pain, unspecified: Secondary | ICD-10-CM

## 2023-11-15 DIAGNOSIS — G8929 Other chronic pain: Secondary | ICD-10-CM

## 2023-11-15 MED ORDER — PREDNISONE 10 MG PO TABS: ORAL_TABLET | ORAL | 0 refills | Status: AC

## 2023-11-15 MED ORDER — METHOCARBAMOL 500 MG PO TABS: 500.0000 mg | ORAL_TABLET | Freq: Every day | ORAL | 0 refills | Status: DC

## 2023-11-15 NOTE — Progress Notes (Signed)
 Date:  11/15/2023   Name:  Renee Gomez   DOB:  1970-10-30   MRN:  [id number]   Chief Complaint: Back Pain (Patient said her back pain started last Wednesday, hard to get up after sitting down, lower back, pain does not radiate anywhere else)  Back Pain This is a new problem. The current episode started in the past 7 days. The problem occurs constantly. The pain is present in the lumbar spine. The quality of the pain is described as burning and aching. The pain is moderate. Pertinent negatives include no chest pain or fever.  Arm Pain  There was no injury mechanism. The pain is present in the right elbow. The quality of the pain is described as aching. The pain is mild. The pain has been Fluctuating since the incident. Pertinent negatives include no chest pain. The symptoms are aggravated by movement and palpation.    Review of Systems  Constitutional:  Negative for diaphoresis and fever.  Respiratory:  Negative for chest tightness and shortness of breath.   Cardiovascular:  Negative for chest pain and palpitations.  Musculoskeletal:  Positive for arthralgias and back pain.  Psychiatric/Behavioral:  Negative for dysphoric mood and sleep disturbance. The patient is not nervous/anxious.      Lab Results  Component Value Date   NA 142 10/07/2023   K 4.2 10/07/2023   CO2 25 10/07/2023   GLUCOSE 88 10/07/2023   BUN 14 10/07/2023   CREATININE 0.63 10/07/2023   CALCIUM  9.4 10/07/2023   EGFR 105 10/07/2023   GFRNONAA 110 04/02/2020   Lab Results  Component Value Date   CHOL 221 (H) 10/07/2023   HDL 60 10/07/2023   LDLCALC 145 (H) 10/07/2023   TRIG 91 10/07/2023   CHOLHDL 3.7 10/07/2023   Lab Results  Component Value Date   TSH 2.220 04/09/2023   Lab Results  Component Value Date   HGBA1C 5.9 (H) 04/09/2023   Lab Results  Component Value Date   WBC 6.2 04/09/2023   HGB 13.6 04/09/2023   HCT 42.2 04/09/2023   MCV 95 04/09/2023   PLT 240 04/09/2023   Lab Results   Component Value Date   ALT 14 10/07/2023   AST 20 10/07/2023   ALKPHOS 68 10/07/2023   BILITOT 0.4 10/07/2023   No results found for: "25OHVITD2", "25OHVITD3", "VD25OH"   Patient Active Problem List   Diagnosis Date Noted   Prediabetes 04/09/2023   Family history of cerebral aneurysm 04/09/2023   Plantar wart of right foot 10/03/2019   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   Vertigo, intermittent 03/15/2018   Low back pain with sciatica 01/18/2015   Fibrocystic breast 01/18/2015   Acid reflux 01/18/2015   Hyperlipidemia, mild 01/18/2015   Knee pain, right 01/18/2015    No Known Allergies  Past Surgical History:  Procedure Laterality Date   APPENDECTOMY     BREAST EXCISIONAL BIOPSY Bilateral 25+yrs ago   neg   BREAST SURGERY     COLONOSCOPY WITH PROPOFOL  N/A 04/24/2019   Procedure: COLONOSCOPY WITH PROPOFOL ;  Surgeon: Selena Daily, MD;  Location: [facility] ENDOSCOPY;  Service: Gastroenterology;  Laterality: N/A;   LITHOTRIPSY  1995   MINOR BREAST BIOPSY Bilateral    neg x 2   TOTAL HIP ARTHROPLASTY Right 12/2012    Social History   Tobacco Use   Smoking status: Never    Passive exposure: Never   Smokeless tobacco: Never  Vaping Use   Vaping status: Never Used  Substance Use Topics   Alcohol use: No    Alcohol/week: 0.0 standard drinks of alcohol   Drug use: No     Medication list has been reviewed and updated.  Current Meds  Medication Sig   Calcium  Carbonate-Vitamin D (CALCIUM  PLUS VITAMIN D PO) Take by mouth daily at 6 (six) AM.   glucosamine-chondroitin 500-400 MG tablet Take 1 tablet by mouth 3 (three) times daily.   neomycin -polymyxin b-dexamethasone (MAXITROL) 3.5-10000-0.1 SUSP Place 2 drops into both eyes every 6 (six) hours.   predniSONE  (DELTASONE ) 10 MG tablet Take 6 tablets (60 mg total) by mouth daily with breakfast for 1 day, THEN 5 tablets (50 mg total) daily with breakfast for 1 day, THEN 4 tablets (40 mg total) daily with  breakfast for 1 day, THEN 3 tablets (30 mg total) daily with breakfast for 1 day, THEN 2 tablets (20 mg total) daily with breakfast for 1 day, THEN 1 tablet (10 mg total) daily with breakfast for 1 day.   rosuvastatin  (CRESTOR ) 10 MG tablet Take 1 tablet (10 mg total) by mouth daily.       11/10/2023   11:22 AM 10/07/2023   11:02 AM 04/09/2023   10:37 AM 12/30/2022    1:37 PM  GAD 7 : Generalized Anxiety Score  Nervous, Anxious, on Edge 1 1 1 1   Control/stop worrying 1 1 1 1   Worry too much - different things 1 1 1 1   Trouble relaxing 1 1 1 1   Restless 1 1 1 1   Easily annoyed or irritable 1 1 2 1   Afraid - awful might happen 1 1 2 1   Total GAD 7 Score 7 7 9 7   Anxiety Difficulty Somewhat difficult Somewhat difficult Very difficult Somewhat difficult       11/10/2023   11:22 AM 10/07/2023   11:02 AM 04/09/2023   10:37 AM  Depression screen PHQ 2/9  Decreased Interest 0 0 1  Down, Depressed, Hopeless 1 1 2   PHQ - 2 Score 1 1 3   Altered sleeping 2 2 2   Tired, decreased energy 2 2 2   Change in appetite 1 1 1   Feeling bad or failure about yourself  1 1 2   Trouble concentrating 1 1 1   Moving slowly or fidgety/restless 0 0 1  Suicidal thoughts 0 0 1  PHQ-9 Score 8 8 13   Difficult doing work/chores Not difficult at all Somewhat difficult Somewhat difficult    BP Readings from Last 3 Encounters:  11/15/23 114/72  11/10/23 116/70  10/07/23 114/72    Physical Exam Vitals and nursing note reviewed.  Constitutional:      General: She is in acute distress.     Appearance: She is well-developed.  HENT:     Head: Normocephalic and atraumatic.  Cardiovascular:     Rate and Rhythm: Normal rate and regular rhythm.  Pulmonary:     Effort: Pulmonary effort is normal. No respiratory distress.  Musculoskeletal:     Right elbow: Swelling present. No effusion. No tenderness.     Lumbar back: Spasms, tenderness and bony tenderness present. Decreased range of motion. Negative right straight  leg raise test and negative left straight leg raise test.     Right lower leg: No edema.     Left lower leg: No edema.  Skin:    General: Skin is warm and dry.     Findings: No rash.  Neurological:     Mental Status: She is alert and oriented to person, place, and  time.     Sensory: Sensation is intact.     Motor: Motor function is intact.     Coordination: Coordination is intact.  Psychiatric:        Mood and Affect: Mood normal.        Behavior: Behavior normal.     Wt Readings from Last 3 Encounters:  11/15/23 122 lb 6 oz (55.5 kg)  11/10/23 122 lb 8 oz (55.6 kg)  10/07/23 121 lb (54.9 kg)    BP 114/72   Pulse 75   Ht 5\' 1"  (1.549 m)   Wt 122 lb 6 oz (55.5 kg)   SpO2 97%   BMI 23.12 kg/m   Assessment and Plan:  Problem List Items Addressed This Visit   None Visit Diagnoses       Acute midline low back pain without sciatica    -  Primary   Recommend steroid taper, robaxin  at HS and ice or heat will get lumbar films to rule out acute process   Relevant Medications   predniSONE  (DELTASONE ) 10 MG tablet   methocarbamol  (ROBAXIN ) 500 MG tablet   Other Relevant Orders   DG Lumbar Spine Complete     Elbow pain, chronic, right       will get imaging. see Dr. Clem Currier   Relevant Medications   predniSONE  (DELTASONE ) 10 MG tablet   methocarbamol  (ROBAXIN ) 500 MG tablet   Other Relevant Orders   DG Elbow 2 Views Right       No follow-ups on file.    Sheron Dixons, MD [facility] Primary Care and Sports Medicine [place]

## 2023-11-16 ENCOUNTER — Encounter: Payer: Self-pay | Admitting: Family Medicine

## 2023-11-16 ENCOUNTER — Telehealth: Payer: Self-pay | Admitting: Internal Medicine

## 2023-11-16 ENCOUNTER — Ambulatory Visit (INDEPENDENT_AMBULATORY_CARE_PROVIDER_SITE_OTHER): Admitting: Family Medicine

## 2023-11-16 VITALS — BP 116/70 | HR 73 | Ht 61.0 in | Wt 123.0 lb

## 2023-11-16 DIAGNOSIS — M7701 Medial epicondylitis, right elbow: Secondary | ICD-10-CM | POA: Diagnosis not present

## 2023-11-16 DIAGNOSIS — M5440 Lumbago with sciatica, unspecified side: Secondary | ICD-10-CM | POA: Diagnosis not present

## 2023-11-16 NOTE — Assessment & Plan Note (Signed)
 Back pain Recent onset of back pain managed with prednisone  and methocarbamol . Prednisone  expected to aid elbow pain due to anti-inflammatory properties. - Continue prednisone  as prescribed for back pain. - Start methocarbamol  at night as needed for muscle relaxation once obtained from the pharmacy. - Evaluate back pain in conjunction with elbow pain at the follow-up visit in four weeks per patient request.

## 2023-11-16 NOTE — Telephone Encounter (Signed)
 Patient asking info on muscle relaxer

## 2023-11-16 NOTE — Assessment & Plan Note (Signed)
 History of Present Illness Renee Gomez is a 53 year old female who presents with chronic right elbow pain.   She has been experiencing chronic pain in her right elbow along the inner aspect for several years. The pain is exacerbated by specific activities, such as lifting a heavy pan, and she describes it as 'still a little bit hard.' She also reports a sensation of tightness in the muscles around the elbow, which she finds strange and painful. Recently, she has started experiencing similar symptoms in her left elbow.  She recently saw Dr. Berglund for back pain and was prescribed prednisone  and methocarbamol . She started prednisone  this morning but has not yet received methocarbamol  from the pharmacy. Methocarbamol  is intended for nighttime use as needed for her back pain.  No issues with reflux or heartburn. 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 She is currently not working and engages in household activities that involve using her hands.  Physical Exam INSPECTION: Right elbow medial aspect examined, symmetric, no swelling, erythema, ecchymosis, atrophy, deformity, or skin changes. STRENGTH: Strength testing performed on right elbow, 5 out of 5 strength, no pain or weakness with resistance. 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 Treatment aimed at reducing inflammation and enhancing muscle flexibility. - Continue prednisone  as prescribed for back pain, which may also help with elbow inflammation. - After completing prednisone , start meloxicam daily as needed for inflammation control, taking with food. - Initiate home exercises for both elbows to improve flexibility and reduce  tightness, starting this weekend if symptoms allow. - Consider referral to physical therapy at [facility] in [place] for additional support and massage therapy. - Apply cold therapy to the inflamed area of the elbow and heat therapy to the muscles to alleviate tightness. - If symptoms persist after 2-4 weeks of treatment, contact for further evaluation and potential injection therapy.

## 2023-11-16 NOTE — Telephone Encounter (Signed)
 Patient was notified.

## 2023-11-16 NOTE — Patient Instructions (Signed)
 Patient Action Plan  For Medial Epicondylitis (Golfer's Elbow): - Continue taking prednisone  as prescribed, which may also help with elbow inflammation. - After finishing prednisone , start taking meloxicam daily as needed for inflammation control. Remember to take it with food. - Begin home exercises for both elbows to improve flexibility and reduce tightness, starting this weekend if you feel able. - Consider a referral to physical therapy at [facility] in [place] for additional support and massage therapy. - Apply cold therapy to the inflamed area of your elbow and use heat therapy on the muscles to alleviate tightness. - If symptoms persist after 2-4 weeks of treatment, contact your healthcare provider for further evaluation and potential injection therapy.  For Back Pain: - Continue taking prednisone  as prescribed. - Start taking methocarbamol  at night as needed for muscle relaxation once you have picked it up from the pharmacy. - Assess your back pain along with your elbow pain during your follow-up visit in four weeks.  Red Flags: - If you experience increased pain, swelling, or any new symptoms, contact your healthcare provider immediately.

## 2023-11-16 NOTE — Progress Notes (Signed)
 Primary Care / Sports Medicine Office Visit  Patient Information:  Patient ID: Renee Gomez, female DOB: 08/08/1970 Age: 53 y.o. MRN: [id number]   [name] is a pleasant 53 y.o. female presenting with the following:  Chief Complaint  Patient presents with   Elbow Pain    Right elbow pain for years. Recently has got worse. She has been using lidocaine  patches for pain. She has swelling. She has difficulty washing dishes, bending her elbow, and extending her arm. She has obtained xray's for today's visit.     Vitals:   11/16/23 1027  BP: 116/70  Pulse: 73  SpO2: 97%   Vitals:   11/16/23 1027  Weight: 123 lb (55.8 kg)  Height: 5\' 1"  (1.549 m)   Body mass index is 23.24 kg/m.  DG Elbow 2 Views Right Result Date: 11/15/2023 CLINICAL DATA:  Chronic swelling EXAM: RIGHT ELBOW - 2 VIEW COMPARISON:  None Available. FINDINGS: There is no evidence of fracture, dislocation, or joint effusion. There is no evidence of arthropathy or other focal bone abnormality. Soft tissues are unremarkable. IMPRESSION: Negative. Electronically Signed   By: Esmeralda Hedge M.D.   On: 11/15/2023 16:09   DG Lumbar Spine Complete Result Date: 11/15/2023 CLINICAL DATA:  Low back pain EXAM: LUMBAR SPINE - COMPLETE 4+ VIEW COMPARISON:  None Available. FINDINGS: Lumbar alignment within normal limits. Vertebral body heights are maintained. Minimal disc space narrowing and osteophyte L2-L3. Mild lower lumbar facet degenerative change. Right hip replacement IMPRESSION: Mild degenerative changes. Electronically Signed   By: Esmeralda Hedge M.D.   On: 11/15/2023 16:04     Independent interpretation of notes and tests performed by another provider:   None  Procedures performed:   None  Pertinent History, Exam, Impression, and Recommendations:   Problem List Items Addressed This Visit     Low back pain with sciatica   Back pain Recent onset of back pain managed with prednisone  and methocarbamol .  Prednisone  expected to aid elbow pain due to anti-inflammatory properties. - Continue prednisone  as prescribed for back pain. - Start methocarbamol  at night as needed for muscle relaxation once obtained from the pharmacy. - Evaluate back pain in conjunction with elbow pain at the follow-up visit in four weeks per patient request.      Medial epicondylitis, right - Primary   History of Present Illness Renee Gomez is a 53 year old female who presents with chronic right elbow pain.   She has been experiencing chronic pain in her right elbow along the inner aspect for several years. The pain is exacerbated by specific activities, such as lifting a heavy pan, and she describes it as 'still a little bit hard.' She also reports a sensation of tightness in the muscles around the elbow, which she finds strange and painful. Recently, she has started experiencing similar symptoms in her left elbow.  She recently saw Dr. Gala Jubilee for back pain and was prescribed prednisone  and methocarbamol . She started prednisone  this morning but has not yet received methocarbamol  from the pharmacy. Methocarbamol  is intended for nighttime use as needed for her back pain.  No issues with reflux or heartburn. 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 She is currently not working and engages in household activities that involve using her hands.  Physical Exam INSPECTION: Right elbow medial aspect examined, symmetric, no swelling, erythema, ecchymosis, atrophy, deformity, or skin changes. STRENGTH: Strength testing performed on right  elbow, 5 out of 5 strength, no pain or weakness with resistance. SPECIAL TESTS: Special tests performed on right elbow, positive provocative testing for medial epicondylitis, equivocal lateral epicondylitis noted  Assessment and Plan Medial epicondylitis (golfer's elbow) Chronic medial epicondylitis with persistent pain due to  overuse and inflammation. Treatment aimed at reducing inflammation and enhancing muscle flexibility. - Continue prednisone  as prescribed for back pain, which may also help with elbow inflammation. - After completing prednisone , start meloxicam daily as needed for inflammation control, taking with food. - Initiate home exercises for both elbows to improve flexibility and reduce tightness, starting this weekend if symptoms allow. - Consider referral to physical therapy at [facility] in [place] for additional support and massage therapy. - Apply cold therapy to the inflamed area of the elbow and heat therapy to the muscles to alleviate tightness. - If symptoms persist after 2-4 weeks of treatment, contact for further evaluation and potential injection therapy.        Orders & Medications Medications: No orders of the defined types were placed in this encounter.  No orders of the defined types were placed in this encounter.    Return in about 4 weeks (around 12/14/2023).     Ma Saupe, MD, [facility]   Primary Care Sports Medicine Primary Care and Sports Medicine at [facility] [place]

## 2023-12-14 ENCOUNTER — Ambulatory Visit: Admitting: Family Medicine

## 2023-12-14 ENCOUNTER — Encounter: Payer: Self-pay | Admitting: Family Medicine

## 2023-12-14 VITALS — BP 120/68 | HR 69 | Ht 61.0 in | Wt 123.0 lb

## 2023-12-14 DIAGNOSIS — M7702 Medial epicondylitis, left elbow: Secondary | ICD-10-CM | POA: Insufficient documentation

## 2023-12-14 DIAGNOSIS — M7701 Medial epicondylitis, right elbow: Secondary | ICD-10-CM | POA: Diagnosis not present

## 2023-12-14 MED ORDER — MELOXICAM 15 MG PO TABS: 15.0000 mg | ORAL_TABLET | Freq: Every day | ORAL | 1 refills | Status: DC

## 2023-12-14 NOTE — Assessment & Plan Note (Addendum)
 History of Present Illness Renee Gomez is a 53 year old right-hand-dominant female who presents with persistent right elbow pain.  Right elbow pain - Persistent pain localized to the right elbow, which is the dominant side - Pain described as 'still sore' - Previous x-rays showed no significant findings, reviewed independently with patient today - Has performed some exercises, but not consistently  Physical Exam RANGE OF MOTION: Elbows symmetrical bilaterally with full active and passive range of motion, focal tenderness right greater than left medial epicondyle that recreates stated symptomatology, otherwise elbow non-tender, no lateral epicondyle pain, olecranon bursa normal, no atrophy, and 5/5 strength.  Positive provocative testing for medial epicondylitis bilaterally.  Results RADIOLOGY Elbow X-ray: No discrete degenerative changes at the elbow articulation, there are noted mild cortical irregularities at the medial and lateral epicondyle, no acute osseous processes identified.  Assessment and Plan Bilateral chronic medial epicondylitis  Chronic elbow pain due to medial epicondylitis. X-rays unremarkable. Previous meloxicam  treatment not initiated due to prescription error. - Prescribed meloxicam , one tablet daily with food for at least two weeks. Discussed that meloxicam  should not to be taken with other NSAIDs, with potential side effects including nausea and stomach upset. Advised taking with food. - Referred to physical therapy in [place], preferably at [facility], for weekly sessions. - Consider cortisone injection if pain persists after two months. Explained injections provide temporary relief, similar in discomfort to a flu shot, and performed with ultrasound guidance. Discussed option of one-time pain medication dose prior to injection if needed. - Schedule follow-up in two months to assess progress and consider injection if pain persists. - Advised to contact if  considering injection for a one-time pain medication prescription for preprocedural pain control

## 2023-12-14 NOTE — Assessment & Plan Note (Signed)
 See additional assessment(s) for plan details.

## 2023-12-14 NOTE — Progress Notes (Signed)
 Primary Care / Sports Medicine Office Visit  Patient Information:  Patient ID: Chloe Miyoshi, female DOB: 03/27/1971 Age: 53 y.o. MRN: [id number]   Larsen Zettel Frickey is a pleasant 53 y.o. female presenting with the following:  Chief Complaint  Patient presents with   Elbow Pain    Right elbow pain. Patient continues to have right elbow pain with movement and tender to touch. Patient has not been taking anything for the pain.     Vitals:   12/14/23 1036  BP: 120/68  Pulse: 69  SpO2: 97%   Vitals:   12/14/23 1036  Weight: 123 lb (55.8 kg)  Height: 5' 1 (1.549 m)   Body mass index is 23.24 kg/m.  DG Elbow 2 Views Right Result Date: 11/15/2023 CLINICAL DATA:  Chronic swelling EXAM: RIGHT ELBOW - 2 VIEW COMPARISON:  None Available. FINDINGS: There is no evidence of fracture, dislocation, or joint effusion. There is no evidence of arthropathy or other focal bone abnormality. Soft tissues are unremarkable. IMPRESSION: Negative. Electronically Signed   By: Luke Bun M.D.   On: 11/15/2023 16:09   DG Lumbar Spine Complete Result Date: 11/15/2023 CLINICAL DATA:  Low back pain EXAM: LUMBAR SPINE - COMPLETE 4+ VIEW COMPARISON:  None Available. FINDINGS: Lumbar alignment within normal limits. Vertebral body heights are maintained. Minimal disc space narrowing and osteophyte L2-L3. Mild lower lumbar facet degenerative change. Right hip replacement IMPRESSION: Mild degenerative changes. Electronically Signed   By: Luke Bun M.D.   On: 11/15/2023 16:04     Independent interpretation of notes and tests performed by another provider:   See below  Procedures performed:   None  Pertinent History, Exam, Impression, and Recommendations:   Problem List Items Addressed This Visit     Medial epicondylitis, left   See additional assessment(s) for plan details.      Relevant Medications   meloxicam  (MOBIC ) 15 MG tablet   Other Relevant Orders   Ambulatory referral to Physical  Therapy   Medial epicondylitis, right - Primary   History of Present Illness Oline Rikayla Demmon is a 53 year old right-hand-dominant female who presents with persistent right elbow pain.  Right elbow pain - Persistent pain localized to the right elbow, which is the dominant side - Pain described as 'still sore' - Previous x-rays showed no significant findings, reviewed independently with patient today - Has performed some exercises, but not consistently  Physical Exam RANGE OF MOTION: Elbows symmetrical bilaterally with full active and passive range of motion, focal tenderness right greater than left medial epicondyle that recreates stated symptomatology, otherwise elbow non-tender, no lateral epicondyle pain, olecranon bursa normal, no atrophy, and 5/5 strength.  Positive provocative testing for medial epicondylitis bilaterally.  Results RADIOLOGY Elbow X-ray: No discrete degenerative changes at the elbow articulation, there are noted mild cortical irregularities at the medial and lateral epicondyle, no acute osseous processes identified.  Assessment and Plan Bilateral chronic medial epicondylitis  Chronic elbow pain due to medial epicondylitis. X-rays unremarkable. Previous meloxicam  treatment not initiated due to prescription error. - Prescribed meloxicam , one tablet daily with food for at least two weeks. Discussed that meloxicam  should not to be taken with other NSAIDs, with potential side effects including nausea and stomach upset. Advised taking with food. - Referred to physical therapy in [place], preferably at [facility], for weekly sessions. - Consider cortisone injection if pain persists after two months. Explained injections provide temporary relief, similar in discomfort to a flu shot,  and performed with ultrasound guidance. Discussed option of one-time pain medication dose prior to injection if needed. - Schedule follow-up in two months to assess progress and consider  injection if pain persists. - Advised to contact if considering injection for a one-time pain medication prescription for preprocedural pain control      Relevant Medications   meloxicam  (MOBIC ) 15 MG tablet   Other Relevant Orders   Ambulatory referral to Physical Therapy     Orders & Medications Medications:  Meds ordered this encounter  Medications   meloxicam  (MOBIC ) 15 MG tablet    Sig: Take 1 tablet (15 mg total) by mouth daily. X 2 week then daily PRN    Dispense:  30 tablet    Refill:  1   Orders Placed This Encounter  Procedures   Ambulatory referral to Physical Therapy     Return in about 2 months (around 02/14/2024) for 40 minute visit.     Selinda JINNY Ku, MD, [facility]   Primary Care Sports Medicine Primary Care and Sports Medicine at [facility] [place]

## 2023-12-14 NOTE — Patient Instructions (Signed)
 Patient Plan for Post-Visit Guidance  1. Right Elbow Pain (Medial Epicondylitis):    - Take meloxicam  15 mg once daily with food for at least two weeks. Do not take with other NSAIDs. Be aware of possible side effects like nausea and stomach upset.    - Attend weekly physical therapy sessions in [place], preferably at [facility].    - Consider a cortisone injection if pain persists after two months. This can provide temporary relief and is similar in discomfort to a flu shot. Contact us  if you want a one-time pain medication prescription before the injection.    - Schedule a follow-up appointment in two months to assess progress and discuss the possibility of an injection if needed.  Red Flags: - If you experience increased pain, new symptoms, or severe side effects from medications, please contact the office for further guidance.

## 2023-12-20 ENCOUNTER — Ambulatory Visit: Attending: Family Medicine

## 2023-12-20 DIAGNOSIS — M6281 Muscle weakness (generalized): Secondary | ICD-10-CM | POA: Diagnosis present

## 2023-12-20 DIAGNOSIS — M25531 Pain in right wrist: Secondary | ICD-10-CM | POA: Diagnosis present

## 2023-12-20 DIAGNOSIS — M7701 Medial epicondylitis, right elbow: Secondary | ICD-10-CM | POA: Insufficient documentation

## 2023-12-20 DIAGNOSIS — M7702 Medial epicondylitis, left elbow: Secondary | ICD-10-CM | POA: Diagnosis not present

## 2023-12-20 DIAGNOSIS — M25521 Pain in right elbow: Secondary | ICD-10-CM | POA: Insufficient documentation

## 2023-12-20 NOTE — Therapy (Signed)
 OUTPATIENT PHYSICAL THERAPY SHOULDER/ELBOW EVALUATION/TREATMENT  Patient Name: Renee Gomez MRN: [id number] DOB:[birth year], 53 y.o., female Today's Date: 12/20/2023  END OF SESSION:  PT End of Session - 12/20/23 1432     Visit Number 1    Number of Visits 17    Date for PT Re-Evaluation 02/14/24    Authorization Type Eval 12/20/23    PT Start Time 1431    PT Stop Time 1515    PT Time Calculation (min) 44 min    Activity Tolerance Patient tolerated treatment well    Behavior During Therapy [facility] for tasks assessed/performed          Past Medical History:  Diagnosis Date   History of kidney stones    Moderate episode of recurrent major depressive disorder (HCC) 03/10/2017   Past Surgical History:  Procedure Laterality Date   APPENDECTOMY     BREAST EXCISIONAL BIOPSY Bilateral 25+yrs ago   neg   BREAST SURGERY     COLONOSCOPY WITH PROPOFOL  N/A 04/24/2019   Procedure: COLONOSCOPY WITH PROPOFOL ;  Surgeon: Unk Corinn Skiff, MD;  Location: [facility] ENDOSCOPY;  Service: Gastroenterology;  Laterality: N/A;   LITHOTRIPSY  1995   MINOR BREAST BIOPSY Bilateral    neg x 2   TOTAL HIP ARTHROPLASTY Right 12/2012   Patient Active Problem List   Diagnosis Date Noted   Medial epicondylitis, left 12/14/2023   Medial epicondylitis, right 11/16/2023   Prediabetes 04/09/2023   Family history of cerebral aneurysm 04/09/2023   Plantar wart of right foot 10/03/2019   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   Vertigo, intermittent 03/15/2018   Low back pain with sciatica 01/18/2015   Fibrocystic breast 01/18/2015   Acid reflux 01/18/2015   Hyperlipidemia, mild 01/18/2015   Knee pain, right 01/18/2015    PCP: Justus Leita DEL, MD   REFERRING PROVIDER: Alvia Selinda PARAS, MD   REFERRING DIAG:  M77.01 (ICD-10-CM) - Medial epicondylitis, right  M77.02 (ICD-10-CM) - Medial epicondylitis, left    RATIONALE FOR EVALUATION AND TREATMENT: Rehabilitation  THERAPY DIAG: Pain  in right elbow  Muscle weakness (generalized)  ONSET DATE: Chronic Elbow Pain  FOLLOW-UP APPT SCHEDULED WITH REFERRING PROVIDER: Yes  SUBJECTIVE:                                                                                                                                                                                         SUBJECTIVE STATEMENT:    Patient reporting to [facility] with a chief concern of Chronic R elbow pain.   PERTINENT HISTORY:   Renee Gomez is a 53 y.o. female reporting to [facility]T with R elbow  pain. She reports that the pain worsens with driving, house work, and moving her arm into an outstretched movement. She reports that pain is alleviated with sleeping and rest. She reports that housework has been difficult with gripping objects. She states that her physician has provided prescription medications (meloxicam ) in order to improve the pain.   Activity Level: She uses the exercise bike and swimming as form of exercise. She reports that swimming has minor pain in the elbow.   Dominant hand: right  Imaging:   CLINICAL DATA:  Chronic swelling   EXAM: RIGHT ELBOW - 2 VIEW   COMPARISON:  None Available.   FINDINGS: There is no evidence of fracture, dislocation, or joint effusion. There is no evidence of arthropathy or other focal bone abnormality. Soft tissues are unremarkable.   IMPRESSION: Negative.     Electronically Signed   By: Luke Bun M.D.   On: 11/15/2023 16:09  PAIN:  Pain Intensity: Present: 8/10, Best: 8/10, Worst: 9/10 Pain location: R Medial/Lateral Elbow Pain Quality: constant and sharp  Radiating: Yes in tho Numbness/Tingling: No Focal Weakness: No 24-hour pain behavior: Activity dependent  History of prior shoulder or neck/shoulder injury, pain, surgery, or therapy: Yes  Red flags (personal history of cancer, chills/fever, night sweats, nausea, vomiting, unrelenting pain, unexplained weight gain/loss):  Negative  PRECAUTIONS: Fall  WEIGHT BEARING RESTRICTIONS: No  FALLS: Has patient fallen in last 6 months? 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 )  Cognition WNL    Gross Musculoskeletal Assessment Tremor: None Bulk: Normal Tone: Increased muscle tension along R Wrist flexor group and R Wrist extensor group  Gait Deferred  Posture  AROM AROM (Normal range in degrees) AROM   Right Left  Shoulder    Flexion WNL WNL  Extension    Abduction WNL* WNL  External Rotation    Internal Rotation    Hands Behind Head WNL*  WNL*  Hands Behind Back WNL WNl      Elbow    Flexion WNL* WNL  Extension WNL* WNl  Pronation WNL*    Supination    (* = pain; Blank rows = not tested)  UE MMT: MMT (out of 5) Right Left   Cervical (isometric)  Flexion WNL  Extension WNL  Lateral Flexion WNL WNL  Rotation WNL WNL      Shoulder   Flexion 4 4  Extension    Abduction 4 4  External rotation 5 5  Internal rotation 5 5  Horizontal abduction    Horizontal adduction    Lower Trapezius    Rhomboids        Elbow  Flexion 5 5  Extension 4 4  Pronation 4* 5  Supination 4 5      Wrist  Flexion 4* 5  Extension 4* 5  Radial deviation    Ulnar deviation        MCP  Flexion    Extension    Abduction    Adduction    (* = pain; Blank rows = not tested)  Sensation Grossly intact  to light touch bilateral UE as determined by testing dermatomes C2-T2. Proprioception and hot/cold testing deferred on this date.  Reflexes R/L Biceps (L3/4):  2+/2+  Triceps (S1/2): 2+/2+  Brachioradialis: 2+/2  Palpation Location LEFT  RIGHT           Medial Epicondyle   2  Lateral Epicondyle   1  Wrist Flexor Group   2  Wrist Extensor Group   1  Bicep Muscle   0  Tricep Muscle   0  Flexor Retinaculum   1      (Blank rows = not tested) Graded on 0-4 scale (0 = no pain, 1 = pain, 2 = pain with wincing/grimacing/flinching, 3 = pain with withdrawal, 4 = unwilling to allow palpation), (Blank rows = not tested)   Passive Accessory Intervertebral Motion Deferred   Accessory Motions/Glides  Elbow: Humeroulnar Ulnar Glide: + for pain  Humeroulnar Radial Glide: + for pain and hypomobile throughout     Muscle Length Testing Deferred    SPECIAL TESTS:  Medial Elbow:  Medial Elbow Test (Elbow Extension with passive wrist supination/extension):  R: + For concordant pain and tightness  TODAY'S TREATMENT  DATE: 12/20/23  Therapeutic Exercise:  Reviewed HEP with pt return demonstration:  Exercises - Standing Wrist Flexor Stretch with Arm Bent  - 1 x daily - 7 x weekly - 3 sets - 30s hold - Standing Wrist Extension Stretch  - 1 x daily - 7 x weekly - 3 sets - 30s hold - Wrist Flexion with Resistance  - 1 x daily - 7 x weekly - 2-3 sets - 10-12 reps - Wrist Extension with Resistance  - 1 x daily - 7 x weekly - 2-3 sets - 10-12 reps   PATIENT EDUCATION:  Education details: HEP, POC, Prognosis  Person educated: Patient Education method: Explanation, Demonstration, and Handouts Education comprehension: verbalized understanding and returned demonstration  HOME EXERCISE PROGRAM:  Access Code: [email or web address] URL: [email or web address] Date: 12/20/2023 Prepared by: Lonni Pall  Exercises - Standing Wrist Flexor Stretch with Arm Bent  - 1 x daily - 7 x weekly - 3 sets - 30s hold - Standing Wrist Extension Stretch  - 1 x daily - 7 x weekly - 3 sets - 30s hold - Wrist Flexion with Resistance  - 1 x daily - 7 x weekly -  2-3 sets - 10-12 reps - Wrist Extension with Resistance  - 1 x daily - 7 x weekly - 2-3 sets - 10-12 reps  ASSESSMENT:  CLINICAL IMPRESSION: Renee Gomez is a pleasant 53 y.o. female who was seen today for physical therapy evaluation and treatment for R medial epicondylitis.Objective findings significant with ROM grossly intact however concordant pain throughout wrist extension and flexion. Palpation findings were significant for moderate tenderness over the right medial epicondyle and wrist flexor group, and the lateral epicondyle and wrist extensor group. She self reported a QuickDASH score of 47.7%, indicating a moderate degree of upper extremity disability impacting daily tasks and activities. Strength testing revealed mild deficits in shoulder, elbow and wrist musculature; notable pain with resisted wrist flexion, extension, and forearm pronation. Overall, the current impairments include pain with resisted wrist and forearm movements, tenderness over medial elbow structures, functional limitations with gripping and lifting, and mild strength deficits, all consistent with chronic overuse injury possibly involving the common flexor tendon origin. She will benefit form skilled physical therapy focused on graded strengthening, pain modulation, and activity modification in order to reduce further injury of R  common extensor/flexor tendon.   OBJECTIVE IMPAIRMENTS: decreased strength, increased edema, increased muscle spasms, and pain.   ACTIVITY LIMITATIONS: carrying, lifting, sleeping, and reach over head  PARTICIPATION LIMITATIONS: meal prep, cleaning, and laundry  PERSONAL FACTORS: Age, Behavior pattern, and Time since onset of injury/illness/exacerbation are also affecting patient's functional outcome.   REHAB POTENTIAL: Good  CLINICAL DECISION MAKING: Stable/uncomplicated  EVALUATION COMPLEXITY: Low   GOALS: Goals reviewed with patient? Yes  SHORT TERM GOALS: Target date:  01/17/2024  Pt will be independent with HEP to improve strength and decrease shoulder pain to improve pain-free function at home and work. Baseline: 12/20/2023: Initial HEP provided Goal status: INITIAL   LONG TERM GOALS: Target date: 02/14/2024   1.  Pt will decrease worst elbow pain by at least 3 points on the NPRS in order to demonstrate clinically significant reduction in shoulder pain. Baseline: 12/20/2023: 9/10 NPS Goal status: INITIAL  2.  Pt will decrease quick DASH score by at least 8% in order to demonstrate clinically significant reduction in disability related to shoulder pain        Baseline: 12/20/2023: 47.7 / 100 = 47.7 % Goal status: INITIAL  3. Pt will increase Wrist Flexion and extension strength by at least 1/2 MMT grade and without pain in order to demonstrate improvement in strength and function         Baseline: 12/20/2023:  Wrist R/L:  Flexion 4* 5  Extension 4* 5   Goal status: INITIAL  3. Pt will report ability to perform one swim bout session without pain in the elbow in order to demonstrate increase in wrist strength and decreased pain.        Baseline: 12/20/2023: Goal status: INITIAL  3. Patient will demonstrate improved grip strength to >=80% of the uninvolved side to support functional hand use during ADLs and reduce strain on the medial elbow        Baseline: 12/20/2023: To be tested  Goal status: INITIAL   PLAN: PT FREQUENCY: 1-2x/week  PT DURATION: 8 weeks  PLANNED INTERVENTIONS: Therapeutic exercises, Therapeutic activity, Neuromuscular re-education, Balance training, Gait training, Patient/Family education, Self Care, Joint mobilization, Joint manipulation, Vestibular training, Canalith repositioning, Orthotic/Fit training, DME instructions, Dry Needling, Electrical stimulation, Spinal manipulation, Spinal mobilization, Cryotherapy, Moist heat, Taping, Traction, Ultrasound, Ionotophoresis 4mg /ml Dexamethasone, Manual therapy, and  Re-evaluation.  PLAN FOR NEXT SESSION: Review HEP; Initiate Strengthening for elbow, forearm, shoulder    Lonni Pall PT, DPT Physical Therapist- [place] 12/20/2023, 4:06 PM

## 2023-12-23 ENCOUNTER — Ambulatory Visit

## 2023-12-23 DIAGNOSIS — M25521 Pain in right elbow: Secondary | ICD-10-CM | POA: Diagnosis not present

## 2023-12-23 DIAGNOSIS — M25531 Pain in right wrist: Secondary | ICD-10-CM

## 2023-12-23 DIAGNOSIS — M6281 Muscle weakness (generalized): Secondary | ICD-10-CM

## 2023-12-23 NOTE — Therapy (Signed)
 OUTPATIENT PHYSICAL THERAPY SHOULDER/ELBOW TREATMENT  Patient Name: Renee Gomez MRN: [id number] DOB:Jun 03, 1971, 53 y.o., female Today's Date: 12/23/2023  END OF SESSION:  PT End of Session - 12/23/23 0811     Visit Number 2    Number of Visits 17    Date for PT Re-Evaluation 02/14/24    Authorization Type Eval 12/20/23    PT Start Time 0815    PT Stop Time 0855    PT Time Calculation (min) 40 min    Activity Tolerance Patient tolerated treatment well    Behavior During Therapy [facility] for tasks assessed/performed          Past Medical History:  Diagnosis Date   History of kidney stones    Moderate episode of recurrent major depressive disorder (HCC) 03/10/2017   Past Surgical History:  Procedure Laterality Date   APPENDECTOMY     BREAST EXCISIONAL BIOPSY Bilateral 25+yrs ago   neg   BREAST SURGERY     COLONOSCOPY WITH PROPOFOL  N/A 04/24/2019   Procedure: COLONOSCOPY WITH PROPOFOL ;  Surgeon: Unk Corinn Skiff, MD;  Location: [facility] ENDOSCOPY;  Service: Gastroenterology;  Laterality: N/A;   LITHOTRIPSY  1995   MINOR BREAST BIOPSY Bilateral    neg x 2   TOTAL HIP ARTHROPLASTY Right 12/2012   Patient Active Problem List   Diagnosis Date Noted   Medial epicondylitis, left 12/14/2023   Medial epicondylitis, right 11/16/2023   Prediabetes 04/09/2023   Family history of cerebral aneurysm 04/09/2023   Plantar wart of right foot 10/03/2019   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   Vertigo, intermittent 03/15/2018   Low back pain with sciatica 01/18/2015   Fibrocystic breast 01/18/2015   Acid reflux 01/18/2015   Hyperlipidemia, mild 01/18/2015   Knee pain, right 01/18/2015    PCP: Justus Leita DEL, MD   REFERRING PROVIDER: Alvia Selinda PARAS, MD   REFERRING DIAG:  M77.01 (ICD-10-CM) - Medial epicondylitis, right  M77.02 (ICD-10-CM) - Medial epicondylitis, left    RATIONALE FOR EVALUATION AND TREATMENT: Rehabilitation  THERAPY DIAG: Pain in right  elbow  Muscle weakness (generalized)  Pain in right wrist  ONSET DATE: Chronic Elbow Pain  FOLLOW-UP APPT SCHEDULED WITH REFERRING PROVIDER: Yes  SUBJECTIVE:                                                                                                                                                                                         SUBJECTIVE STATEMENT:    Patient reporting to [facility] with a chief concern of Chronic R elbow pain.   PERTINENT HISTORY:   Renee Gomez is a 53 y.o. female reporting  to [facility] with R elbow pain. She reports that the pain worsens with driving, house work, and moving her arm into an outstretched movement. She reports that pain is alleviated with sleeping and rest. She reports that housework has been difficult with gripping objects. She states that her physician has provided prescription medications (meloxicam ) in order to improve the pain.   Activity Level: She uses the exercise bike and swimming as form of exercise. She reports that swimming has minor pain in the elbow.   Dominant hand: right  Imaging:   CLINICAL DATA:  Chronic swelling   EXAM: RIGHT ELBOW - 2 VIEW   COMPARISON:  None Available.   FINDINGS: There is no evidence of fracture, dislocation, or joint effusion. There is no evidence of arthropathy or other focal bone abnormality. Soft tissues are unremarkable.   IMPRESSION: Negative.     Electronically Signed   By: Luke Bun M.D.   On: 11/15/2023 16:09  PAIN:  Pain Intensity: Present: 8/10, Best: 8/10, Worst: 9/10 Pain location: R Medial/Lateral Elbow Pain Quality: constant and sharp  Radiating: Yes in tho Numbness/Tingling: No Focal Weakness: No 24-hour pain behavior: Activity dependent  History of prior shoulder or neck/shoulder injury, pain, surgery, or therapy: Yes  Red flags (personal history of cancer, chills/fever, night sweats, nausea, vomiting, unrelenting pain, unexplained weight gain/loss):  Negative  PRECAUTIONS: Fall  WEIGHT BEARING RESTRICTIONS: No  FALLS: Has patient fallen in last 6 months? 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 )  Cognition WNL    Gross Musculoskeletal Assessment Tremor: None Bulk: Normal Tone: Increased muscle tension along R Wrist flexor group and R Wrist extensor group  Gait Deferred  Posture  AROM AROM (Normal range in degrees) AROM   Right Left  Shoulder    Flexion WNL WNL  Extension    Abduction WNL* WNL  External Rotation    Internal Rotation    Hands Behind Head WNL*  WNL*  Hands Behind Back WNL WNl      Elbow    Flexion WNL* WNL  Extension WNL* WNl  Pronation WNL*    Supination    (* = pain; Blank rows = not tested)  UE MMT: MMT (out of 5) Right Left   Cervical (isometric)  Flexion WNL  Extension WNL  Lateral Flexion WNL WNL  Rotation WNL WNL      Shoulder   Flexion 4 4  Extension    Abduction 4 4  External rotation 5 5  Internal rotation 5 5  Horizontal abduction    Horizontal adduction    Lower Trapezius    Rhomboids        Elbow  Flexion 5 5  Extension 4 4  Pronation 4* 5  Supination 4 5      Wrist  Flexion 4* 5  Extension 4* 5  Radial deviation    Ulnar deviation        MCP  Flexion    Extension    Abduction    Adduction    (* = pain; Blank rows = not  tested)  Sensation Grossly intact to light touch bilateral UE as determined by testing dermatomes C2-T2. Proprioception and hot/cold testing deferred on this date.  Reflexes R/L Biceps (L3/4):  2+/2+  Triceps (S1/2): 2+/2+  Brachioradialis: 2+/2  Palpation Location LEFT  RIGHT           Medial Epicondyle   2  Lateral Epicondyle   1  Wrist Flexor Group   2  Wrist Extensor Group   1  Bicep Muscle   0  Tricep Muscle   0  Flexor Retinaculum   1      (Blank rows = not tested) Graded on 0-4 scale (0 = no pain, 1 = pain, 2 = pain with wincing/grimacing/flinching, 3 = pain with withdrawal, 4 = unwilling to allow palpation), (Blank rows = not tested)   Passive Accessory Intervertebral Motion Deferred   Accessory Motions/Glides  Elbow: Humeroulnar Ulnar Glide: + for pain  Humeroulnar Radial Glide: + for pain and hypomobile throughout     Muscle Length Testing Deferred    SPECIAL TESTS:  Medial Elbow:  Medial Elbow Test (Elbow Extension with passive wrist supination/extension):  R: + For concordant pain and tightness  TODAY'S TREATMENT  DATE: 12/23/23  Subjective: Patient reports 8/10 in the R medial elbow. Patient reported attempting the HEP without any adverse effects.   Therapeutic Exercise:   Seated R Wrist Flexor Stretch   30s/bout x 3 in order to improve pain and tissue extensibility    Wrist Flexion isometric   R: 2 x 6 x 10s hold, Green TB     Wrist Extension Isometric   R: 2 x 6 x 10s hold, Green TB     Resisted Supination against resistance - Supported by [employer]   R: 2 x 10, Green TB   Resisted Supination against resistance    R: 2 x 10, 3# DB       Standing Shoulder Row    2 x 10, Blue TB   Time spent on activity modification at home and elbow bracing in order to reduce tensile forces at common flexor tendon attachment.   Manual Therapy (10 min billed):  Light STM applied to R wrist flexor musculature, common flexor tendon for pain modulation and decrease muscle tension. Myofascial release and cross friction massage techniques utilized. Pt endorsed improvements in pain following intervention.    PATIENT EDUCATION:  Education  details: HEP, POC, Prognosis  Person educated: Patient Education method: Explanation, Demonstration, and Handouts Education comprehension: verbalized understanding and returned demonstration  HOME EXERCISE PROGRAM:  Access Code: [email or web address] URL: [email or web address] Date: 12/23/2023 Prepared by: Lonni Pall  Exercises - Standing Wrist Flexor Stretch with Arm Bent  - 1 x daily - 7 x weekly - 3 sets - 30s hold - Standing Wrist Extension Stretch  - 1 x daily - 7 x weekly - 3 sets - 30s hold - Wrist Flexion with Resistance  - 1 x daily - 7 x weekly - 2-3 sets - 10-12 reps - Wrist Extension with Resistance  - 1 x daily - 7 x weekly - 2-3 sets - 10-12 reps - Forearm Supination with Resistance  - 1 x daily - 7 x weekly - 2-3 sets - 10-12 reps - Standing Shoulder Row with Anchored Resistance  - 1 x daily - 3-4 x weekly - 2-3 sets - 10-12 reps  Access Code: [email or web address] URL: [email or web address] Date: 12/20/2023 Prepared by: Lonni Pall  Exercises - Standing Wrist Flexor Stretch with Arm Bent  - 1  x daily - 7 x weekly - 3 sets - 30s hold - Standing Wrist Extension Stretch  - 1 x daily - 7 x weekly - 3 sets - 30s hold - Wrist Flexion with Resistance  - 1 x daily - 7 x weekly - 2-3 sets - 10-12 reps - Wrist Extension with Resistance  - 1 x daily - 7 x weekly - 2-3 sets - 10-12 reps  ASSESSMENT:  CLINICAL IMPRESSION: Patient returns to [facility] for her first follow up in management of chronic R medial epicondilitis  She tolerated elbow/wrist strengthening exercises without exacerbation of pain. Manual techniques utilized in order to reduce muscle tension in the wrist flexor group. PT educated patient on proper activity modification at home in order to reduce further injury to flexor tendon. PT advised of elbow brace in order to reduce tension at the medial epicondyle. Next session to include grip strength testing in order t compare R grip to a LUE.  She will continue to  benefit from skilled physical therapy focused on graded strengthening, pain modulation, and activity modification in order to reduce further injury of R common extensor/flexor tendon.    OBJECTIVE IMPAIRMENTS: decreased strength, increased edema, increased muscle spasms, and pain.   ACTIVITY LIMITATIONS: carrying, lifting, sleeping, and reach over head  PARTICIPATION LIMITATIONS: meal prep, cleaning, and laundry  PERSONAL FACTORS: Age, Behavior pattern, and Time since onset of injury/illness/exacerbation are also affecting patient's functional outcome.   REHAB POTENTIAL: Good  CLINICAL DECISION MAKING: Stable/uncomplicated  EVALUATION COMPLEXITY: Low   GOALS: Goals reviewed with patient? Yes  SHORT TERM GOALS: Target date: 01/20/2024  Pt will be independent with HEP to improve strength and decrease shoulder pain to improve pain-free function at home and work. Baseline: 12/20/2023: Initial HEP provided Goal status: INITIAL   LONG TERM GOALS: Target date: 02/17/2024   1.  Pt will decrease worst elbow pain by at least 3 points on the NPRS in order to demonstrate clinically significant reduction in shoulder pain. Baseline: 12/20/2023: 9/10 NPS Goal status: INITIAL  2.  Pt will decrease quick DASH score by at least 8% in order to demonstrate clinically significant reduction in disability related to shoulder pain        Baseline: 12/20/2023: 47.7 / 100 = 47.7 % Goal status: INITIAL  3. Pt will increase Wrist Flexion and extension strength by at least 1/2 MMT grade and without pain in order to demonstrate improvement in strength and function         Baseline: 12/20/2023:  Wrist R/L:  Flexion 4* 5  Extension 4* 5   Goal status: INITIAL  3. Pt will report ability to perform one swim bout session without pain in the elbow in order to demonstrate increase in wrist strength and decreased pain.        Baseline: 12/20/2023: Goal status: INITIAL  3. Patient will demonstrate improved  grip strength to >=80% of the uninvolved side to support functional hand use during ADLs and reduce strain on the medial elbow        Baseline: 12/20/2023: To be tested  Goal status: INITIAL   PLAN: PT FREQUENCY: 1-2x/week  PT DURATION: 8 weeks  PLANNED INTERVENTIONS: Therapeutic exercises, Therapeutic activity, Neuromuscular re-education, Balance training, Gait training, Patient/Family education, Self Care, Joint mobilization, Joint manipulation, Vestibular training, Canalith repositioning, Orthotic/Fit training, DME instructions, Dry Needling, Electrical stimulation, Spinal manipulation, Spinal mobilization, Cryotherapy, Moist heat, Taping, Traction, Ultrasound, Ionotophoresis 4mg /ml Dexamethasone, Manual therapy, and Re-evaluation.  PLAN FOR NEXT SESSION: Review HEP;  Initiate Strengthening for elbow, forearm, shoulder    Lonni Pall PT, DPT Physical Therapist- [place] 12/23/2023, 12:25 PM

## 2023-12-27 ENCOUNTER — Ambulatory Visit

## 2023-12-27 DIAGNOSIS — M25531 Pain in right wrist: Secondary | ICD-10-CM

## 2023-12-27 DIAGNOSIS — M6281 Muscle weakness (generalized): Secondary | ICD-10-CM

## 2023-12-27 DIAGNOSIS — M25521 Pain in right elbow: Secondary | ICD-10-CM | POA: Diagnosis not present

## 2023-12-27 NOTE — Therapy (Signed)
 OUTPATIENT PHYSICAL THERAPY SHOULDER/ELBOW TREATMENT  Patient Name: Renee Gomez MRN: [id number] DOB:[birth year], 53 y.o., female Today's Date: 12/27/2023  END OF SESSION:  PT End of Session - 12/27/23 0946     Visit Number 3    Number of Visits 17    Date for PT Re-Evaluation 02/14/24    Authorization Type Eval 12/20/23    PT Start Time 0945    PT Stop Time 1025    PT Time Calculation (min) 40 min    Activity Tolerance Patient tolerated treatment well    Behavior During Therapy [facility] for tasks assessed/performed          Past Medical History:  Diagnosis Date   History of kidney stones    Moderate episode of recurrent major depressive disorder (HCC) 03/10/2017   Past Surgical History:  Procedure Laterality Date   APPENDECTOMY     BREAST EXCISIONAL BIOPSY Bilateral 25+yrs ago   neg   BREAST SURGERY     COLONOSCOPY WITH PROPOFOL  N/A 04/24/2019   Procedure: COLONOSCOPY WITH PROPOFOL ;  Surgeon: Unk Corinn Skiff, MD;  Location: [facility] ENDOSCOPY;  Service: Gastroenterology;  Laterality: N/A;   LITHOTRIPSY  1995   MINOR BREAST BIOPSY Bilateral    neg x 2   TOTAL HIP ARTHROPLASTY Right 12/2012   Patient Active Problem List   Diagnosis Date Noted   Medial epicondylitis, left 12/14/2023   Medial epicondylitis, right 11/16/2023   Prediabetes 04/09/2023   Family history of cerebral aneurysm 04/09/2023   Plantar wart of right foot 10/03/2019   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   Vertigo, intermittent 03/15/2018   Low back pain with sciatica 01/18/2015   Fibrocystic breast 01/18/2015   Acid reflux 01/18/2015   Hyperlipidemia, mild 01/18/2015   Knee pain, right 01/18/2015    PCP: Justus Leita DEL, MD   REFERRING PROVIDER: Alvia Selinda PARAS, MD   REFERRING DIAG:  M77.01 (ICD-10-CM) - Medial epicondylitis, right  M77.02 (ICD-10-CM) - Medial epicondylitis, left    RATIONALE FOR EVALUATION AND TREATMENT: Rehabilitation  THERAPY DIAG: Pain in right  elbow  Muscle weakness (generalized)  Pain in right wrist  ONSET DATE: Chronic Elbow Pain  FOLLOW-UP APPT SCHEDULED WITH REFERRING PROVIDER: Yes  SUBJECTIVE:                                                                                                                                                                                         SUBJECTIVE STATEMENT:    Patient reporting to OPPT with a chief concern of Chronic R elbow pain.   PERTINENT HISTORY:   Renee Gomez is a 53 y.o. female reporting  to OPPT with R elbow pain. She reports that the pain worsens with driving, house work, and moving her arm into an outstretched movement. She reports that pain is alleviated with sleeping and rest. She reports that housework has been difficult with gripping objects. She states that her physician has provided prescription medications (meloxicam ) in order to improve the pain.   Activity Level: She uses the exercise bike and swimming as form of exercise. She reports that swimming has minor pain in the elbow.   Dominant hand: right  Imaging:   CLINICAL DATA:  Chronic swelling   EXAM: RIGHT ELBOW - 2 VIEW   COMPARISON:  None Available.   FINDINGS: There is no evidence of fracture, dislocation, or joint effusion. There is no evidence of arthropathy or other focal bone abnormality. Soft tissues are unremarkable.   IMPRESSION: Negative.     Electronically Signed   By: Luke Bun M.D.   On: 11/15/2023 16:09  PAIN:  Pain Intensity: Present: 8/10, Best: 8/10, Worst: 9/10 Pain location: R Medial/Lateral Elbow Pain Quality: constant and sharp  Radiating: Yes in tho Numbness/Tingling: No Focal Weakness: No 24-hour pain behavior: Activity dependent  History of prior shoulder or neck/shoulder injury, pain, surgery, or therapy: Yes  Red flags (personal history of cancer, chills/fever, night sweats, nausea, vomiting, unrelenting pain, unexplained weight gain/loss):  Negative  PRECAUTIONS: Fall  WEIGHT BEARING RESTRICTIONS: No  FALLS: Has patient fallen in last 6 months? 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 )  Cognition WNL    Gross Musculoskeletal Assessment Tremor: None Bulk: Normal Tone: Increased muscle tension along R Wrist flexor group and R Wrist extensor group  Gait Deferred  Posture  AROM AROM (Normal range in degrees) AROM   Right Left  Shoulder    Flexion WNL WNL  Extension    Abduction WNL* WNL  External Rotation    Internal Rotation    Hands Behind Head WNL*  WNL*  Hands Behind Back WNL WNl      Elbow    Flexion WNL* WNL  Extension WNL* WNl  Pronation WNL*    Supination    (* = pain; Blank rows = not tested)  UE MMT: MMT (out of 5) Right Left   Cervical (isometric)  Flexion WNL  Extension WNL  Lateral Flexion WNL WNL  Rotation WNL WNL      Shoulder   Flexion 4 4  Extension    Abduction 4 4  External rotation 5 5  Internal rotation 5 5  Horizontal abduction    Horizontal adduction    Lower Trapezius    Rhomboids        Elbow  Flexion 5 5  Extension 4 4  Pronation 4* 5  Supination 4 5      Wrist  Flexion 4* 5  Extension 4* 5  Radial deviation    Ulnar deviation        MCP  Flexion    Extension    Abduction    Adduction    (* = pain; Blank rows = not  tested)  Sensation Grossly intact to light touch bilateral UE as determined by testing dermatomes C2-T2. Proprioception and hot/cold testing deferred on this date.  Reflexes R/L Biceps (L3/4):  2+/2+  Triceps (S1/2): 2+/2+  Brachioradialis: 2+/2  Palpation Location LEFT  RIGHT           Medial Epicondyle   2  Lateral Epicondyle   1  Wrist Flexor Group   2  Wrist Extensor Group   1  Bicep Muscle   0  Tricep Muscle   0  Flexor Retinaculum   1      (Blank rows = not tested) Graded on 0-4 scale (0 = no pain, 1 = pain, 2 = pain with wincing/grimacing/flinching, 3 = pain with withdrawal, 4 = unwilling to allow palpation), (Blank rows = not tested)   Passive Accessory Intervertebral Motion Deferred   Accessory Motions/Glides  Elbow: Humeroulnar Ulnar Glide: + for pain  Humeroulnar Radial Glide: + for pain and hypomobile throughout     Muscle Length Testing Deferred    SPECIAL TESTS:  Medial Elbow:  Medial Elbow Test (Elbow Extension with passive wrist supination/extension):  R: + For concordant pain and tightness  TODAY'S TREATMENT  DATE: 12/27/23  Subjective: Patient reports 7/10 in the R medial elbow. Patient reports that she did some of the HEP this past weekend in order to reduce the pain.  Patient brought tennis elbow brace to session. No questions or concerns.   Objective: Grip Strength: R/L : - 25lbs /35 lbs  Therapeutic Exercise:   Seated R Wrist Flexor Stretch   30s/bout x 3 in order to improve pain and tissue extensibility   Seated R Wrist Extensor Stretch   30s/bout x 3 in order to improve ROM and tissue extensibility    Resisted Pronation/Supination   2 x 10 ea direction, 3# DB    Wrist Flexion against DB    2 x 10, 4# DB     Wrist Extension against DB    2 x 10, 4# DB    Standing Shoulder Row    2 x 10, Blue TB    Standing Scapular Retraction with TB Pull Apart   2 x 10, Red TB      Time spent on educating patient with donn technique of elbow brace for the R Arm.   Manual Therapy (10 min billed):  Light STM applied to R wrist flexor musculature, common flexor tendon for pain modulation and decrease muscle tension. Myofascial  release and cross friction massage techniques utilized. Pt endorsed improvements in pain following intervention.    PATIENT EDUCATION:  Education details: HEP, POC, Prognosis  Person educated: Patient Education method: Explanation, Demonstration, and Handouts Education comprehension: verbalized understanding and returned demonstration  HOME EXERCISE PROGRAM:  Access Code: [email or web address] URL: [email or web address] Date: 12/23/2023 Prepared by: Lonni Pall  Exercises - Standing Wrist Flexor Stretch with Arm Bent  - 1 x daily - 7 x weekly - 3 sets - 30s hold - Standing Wrist Extension Stretch  - 1 x daily - 7 x weekly - 3 sets - 30s hold - Wrist Flexion with Resistance  - 1 x daily - 7 x weekly - 2-3 sets - 10-12 reps - Wrist Extension with Resistance  - 1 x daily - 7 x weekly - 2-3 sets - 10-12 reps - Forearm Supination with Resistance  - 1 x daily - 7 x weekly - 2-3 sets - 10-12 reps - Standing Shoulder Row with Anchored Resistance  - 1 x  daily - 3-4 x weekly - 2-3 sets - 10-12 reps  Access Code: [email or web address] URL: [email or web address] Date: 12/20/2023 Prepared by: Lonni Pall  Exercises - Standing Wrist Flexor Stretch with Arm Bent  - 1 x daily - 7 x weekly - 3 sets - 30s hold - Standing Wrist Extension Stretch  - 1 x daily - 7 x weekly - 3 sets - 30s hold - Wrist Flexion with Resistance  - 1 x daily - 7 x weekly - 2-3 sets - 10-12 reps - Wrist Extension with Resistance  - 1 x daily - 7 x weekly - 2-3 sets - 10-12 reps  ASSESSMENT:  CLINICAL IMPRESSION: Continued PT POC focused on chronic R medial epicondylitis. Exercises focused on strengthening common flexor tendon in order to reduce further injury. She tolerated all interventions without exacerbation of pain in the medial/lateral elbow. Manual techniques utilized in order to reduce muscle tension in the wrist flexor and extensor group. PT educated patient on proper donning technique with elbow brace in  order to reduce tension in the common flexor tendon. She will continue to benefit from skilled physical therapy focused on graded strengthening, pain modulation, and activity modification in order to reduce further injury of R common extensor/flexor tendon.    OBJECTIVE IMPAIRMENTS: decreased strength, increased edema, increased muscle spasms, and pain.   ACTIVITY LIMITATIONS: carrying, lifting, sleeping, and reach over head  PARTICIPATION LIMITATIONS: meal prep, cleaning, and laundry  PERSONAL FACTORS: Age, Behavior pattern, and Time since onset of injury/illness/exacerbation are also affecting patient's functional outcome.   REHAB POTENTIAL: Good  CLINICAL DECISION MAKING: Stable/uncomplicated  EVALUATION COMPLEXITY: Low   GOALS: Goals reviewed with patient? Yes  SHORT TERM GOALS: Target date: 01/24/2024  Pt will be independent with HEP to improve strength and decrease shoulder pain to improve pain-free function at home and work. Baseline: 12/20/2023: Initial HEP provided Goal status: INITIAL   LONG TERM GOALS: Target date: 02/21/2024   1.  Pt will decrease worst elbow pain by at least 3 points on the NPRS in order to demonstrate clinically significant reduction in shoulder pain. Baseline: 12/20/2023: 9/10 NPS Goal status: INITIAL  2.  Pt will decrease quick DASH score by at least 8% in order to demonstrate clinically significant reduction in disability related to shoulder pain        Baseline: 12/20/2023: 47.7 / 100 = 47.7 % Goal status: INITIAL  3. Pt will increase Wrist Flexion and extension strength by at least 1/2 MMT grade and without pain in order to demonstrate improvement in strength and function         Baseline: 12/20/2023:  Wrist R/L:  Flexion 4* 5  Extension 4* 5   Goal status: INITIAL  3. Pt will report ability to perform one swim bout session without pain in the elbow in order to demonstrate increase in wrist strength and decreased pain.        Baseline:  12/20/2023: Goal status: INITIAL  3. Patient will demonstrate improved grip strength to >=80% of the uninvolved side to support functional hand use during ADLs and reduce strain on the medial elbow        Baseline: 12/20/2023: To be tested  Goal status: INITIAL   PLAN: PT FREQUENCY: 1-2x/week  PT DURATION: 8 weeks  PLANNED INTERVENTIONS: Therapeutic exercises, Therapeutic activity, Neuromuscular re-education, Balance training, Gait training, Patient/Family education, Self Care, Joint mobilization, Joint manipulation, Vestibular training, Canalith repositioning, Orthotic/Fit training, DME instructions, Dry Needling, Electrical stimulation, Spinal manipulation, Spinal mobilization, Cryotherapy, Moist heat,  Taping, Traction, Ultrasound, Ionotophoresis 4mg /ml Dexamethasone, Manual therapy, and Re-evaluation.  PLAN FOR NEXT SESSION: Review HEP; Initiate Strengthening for elbow, forearm, shoulder    Lonni Pall PT, DPT Physical Therapist- [place] 12/27/2023, 9:47 AM

## 2023-12-30 ENCOUNTER — Ambulatory Visit

## 2023-12-30 DIAGNOSIS — M6281 Muscle weakness (generalized): Secondary | ICD-10-CM

## 2023-12-30 DIAGNOSIS — M25521 Pain in right elbow: Secondary | ICD-10-CM

## 2023-12-30 DIAGNOSIS — M25531 Pain in right wrist: Secondary | ICD-10-CM

## 2023-12-30 NOTE — Therapy (Addendum)
 OUTPATIENT PHYSICAL THERAPY SHOULDER/ELBOW TREATMENT  Patient Name: Renee Gomez MRN: [id number] DOB:04/29/1971, 53 y.o., female Today's Date: 12/30/2023  END OF SESSION:  PT End of Session - 12/30/23 1302     Visit Number 4    Number of Visits 17    Date for PT Re-Evaluation 02/14/24    Authorization Type Eval 12/20/23    PT Start Time 1301    PT Stop Time 1340    PT Time Calculation (min) 39 min    Activity Tolerance Patient tolerated treatment well    Behavior During Therapy [facility] for tasks assessed/performed          Past Medical History:  Diagnosis Date   History of kidney stones    Moderate episode of recurrent major depressive disorder (HCC) 03/10/2017   Past Surgical History:  Procedure Laterality Date   APPENDECTOMY     BREAST EXCISIONAL BIOPSY Bilateral 25+yrs ago   neg   BREAST SURGERY     COLONOSCOPY WITH PROPOFOL  N/A 04/24/2019   Procedure: COLONOSCOPY WITH PROPOFOL ;  Surgeon: Unk Corinn Skiff, MD;  Location: [facility] ENDOSCOPY;  Service: Gastroenterology;  Laterality: N/A;   LITHOTRIPSY  1995   MINOR BREAST BIOPSY Bilateral    neg x 2   TOTAL HIP ARTHROPLASTY Right 12/2012   Patient Active Problem List   Diagnosis Date Noted   Medial epicondylitis, left 12/14/2023   Medial epicondylitis, right 11/16/2023   Prediabetes 04/09/2023   Family history of cerebral aneurysm 04/09/2023   Plantar wart of right foot 10/03/2019   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   Vertigo, intermittent 03/15/2018   Low back pain with sciatica 01/18/2015   Fibrocystic breast 01/18/2015   Acid reflux 01/18/2015   Hyperlipidemia, mild 01/18/2015   Knee pain, right 01/18/2015    PCP: Justus Leita DEL, MD   REFERRING PROVIDER: Alvia Selinda PARAS, MD   REFERRING DIAG:  M77.01 (ICD-10-CM) - Medial epicondylitis, right  M77.02 (ICD-10-CM) - Medial epicondylitis, left    RATIONALE FOR EVALUATION AND TREATMENT: Rehabilitation  THERAPY DIAG: Pain in right  elbow  Muscle weakness (generalized)  Pain in right wrist  ONSET DATE: Chronic Elbow Pain  FOLLOW-UP APPT SCHEDULED WITH REFERRING PROVIDER: Yes  SUBJECTIVE:                                                                                                                                                                                         SUBJECTIVE STATEMENT:    Patient reporting to OPPT with a chief concern of Chronic R elbow pain.   PERTINENT HISTORY:   [name] is a 53 y.o. female reporting  to OPPT with R elbow pain. She reports that the pain worsens with driving, house work, and moving her arm into an outstretched movement. She reports that pain is alleviated with sleeping and rest. She reports that housework has been difficult with gripping objects. She states that her physician has provided prescription medications (meloxicam ) in order to improve the pain.   Activity Level: She uses the exercise bike and swimming as form of exercise. She reports that swimming has minor pain in the elbow.   Dominant hand: right  Imaging:   CLINICAL DATA:  Chronic swelling   EXAM: RIGHT ELBOW - 2 VIEW   COMPARISON:  None Available.   FINDINGS: There is no evidence of fracture, dislocation, or joint effusion. There is no evidence of arthropathy or other focal bone abnormality. Soft tissues are unremarkable.   IMPRESSION: Negative.     Electronically Signed   By: Luke Bun M.D.   On: 11/15/2023 16:09  PAIN:  Pain Intensity: Present: 8/10, Best: 8/10, Worst: 9/10 Pain location: R Medial/Lateral Elbow Pain Quality: constant and sharp  Radiating: Yes in tho Numbness/Tingling: No Focal Weakness: No 24-hour pain behavior: Activity dependent  History of prior shoulder or neck/shoulder injury, pain, surgery, or therapy: Yes  Red flags (personal history of cancer, chills/fever, night sweats, nausea, vomiting, unrelenting pain, unexplained weight gain/loss):  Negative  PRECAUTIONS: Fall  WEIGHT BEARING RESTRICTIONS: No  FALLS: Has patient fallen in last 6 months? 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 )  Cognition WNL    Gross Musculoskeletal Assessment Tremor: None Bulk: Normal Tone: Increased muscle tension along R Wrist flexor group and R Wrist extensor group  Gait Deferred  Posture  AROM AROM (Normal range in degrees) AROM   Right Left  Shoulder    Flexion WNL WNL  Extension    Abduction WNL* WNL  External Rotation    Internal Rotation    Hands Behind Head WNL*  WNL*  Hands Behind Back WNL WNl      Elbow    Flexion WNL* WNL  Extension WNL* WNl  Pronation WNL*    Supination    (* = pain; Blank rows = not tested)  UE MMT: MMT (out of 5) Right Left   Cervical (isometric)  Flexion WNL  Extension WNL  Lateral Flexion WNL WNL  Rotation WNL WNL      Shoulder   Flexion 4 4  Extension    Abduction 4 4  External rotation 5 5  Internal rotation 5 5  Horizontal abduction    Horizontal adduction    Lower Trapezius    Rhomboids        Elbow  Flexion 5 5  Extension 4 4  Pronation 4* 5  Supination 4 5      Wrist  Flexion 4* 5  Extension 4* 5  Radial deviation    Ulnar deviation        MCP  Flexion    Extension    Abduction    Adduction    (* = pain; Blank rows = not  tested)  Sensation Grossly intact to light touch bilateral UE as determined by testing dermatomes C2-T2. Proprioception and hot/cold testing deferred on this date.  Reflexes R/L Biceps (L3/4):  2+/2+  Triceps (S1/2): 2+/2+  Brachioradialis: 2+/2  Palpation Location LEFT  RIGHT           Medial Epicondyle   2  Lateral Epicondyle   1  Wrist Flexor Group   2  Wrist Extensor Group   1  Bicep Muscle   0  Tricep Muscle   0  Flexor Retinaculum   1      (Blank rows = not tested) Graded on 0-4 scale (0 = no pain, 1 = pain, 2 = pain with wincing/grimacing/flinching, 3 = pain with withdrawal, 4 = unwilling to allow palpation), (Blank rows = not tested)   Passive Accessory Intervertebral Motion Deferred   Accessory Motions/Glides  Elbow: Humeroulnar Ulnar Glide: + for pain  Humeroulnar Radial Glide: + for pain and hypomobile throughout     Muscle Length Testing Deferred    SPECIAL TESTS:  Medial Elbow:  Medial Elbow Test (Elbow Extension with passive wrist supination/extension):  R: + For concordant pain and tightness  TODAY'S TREATMENT  DATE: 12/30/23  Subjective: Patient reports 6-7/10 in the R medial elbow. Patient reports 5/10 in lateral epicondyle.  Patient brought tennis elbow brace to session. No questions or concerns.    Therapeutic Exercise:   Resisted Pronation/Supination   2 x 10 ea direction, 4# DB    Wrist Flexion with DB    2 x 10, 4#     Wrist Extension against DB    1 x 10, 4#    1 x 10, 5# - Eccentric Focus     Standing Shoulder Row    2 x 10, Blue TB    Standing Scapular Retraction with External Rotation 1 x 10, Red TB  1 x 10, Green TB  Standing Shoulder Abduction against resistance   2 x 10, Red TB   Manual Therapy (8 min billed):  Light STM applied to R wrist flexor musculature, common flexor tendon for pain modulation and decrease muscle tension. Myofascial release and cross friction massage techniques utilized. Pt endorsed improvements in pain following intervention.   Manual Seated R Wrist Flexor Stretch   30s/bout x 3 in order to improve pain and tissue extensibility  PATIENT EDUCATION:  Education details: HEP, POC,  Prognosis  Person educated: Patient Education method: Explanation, Demonstration, and Handouts Education comprehension: verbalized understanding and returned demonstration  HOME EXERCISE PROGRAM:  Access Code: [email or web address] URL: [email or web address] Date: 12/23/2023 Prepared by: Lonni Pall  Exercises - Standing Wrist Flexor Stretch with Arm Bent  - 1 x daily - 7 x weekly - 3 sets - 30s hold - Standing Wrist Extension Stretch  - 1 x daily - 7 x weekly - 3 sets - 30s hold - Wrist Flexion with Resistance  - 1 x daily - 7 x weekly - 2-3 sets - 10-12 reps - Wrist Extension with Resistance  - 1 x daily - 7 x weekly - 2-3 sets - 10-12 reps - Forearm Supination with Resistance  - 1 x daily - 7 x weekly - 2-3 sets - 10-12 reps - Standing Shoulder Row with Anchored Resistance  - 1 x daily - 3-4 x weekly - 2-3 sets - 10-12 reps  Access Code: [email or web address] URL: [email or web address] Date: 12/20/2023 Prepared by: Lonni Pall  Exercises - Standing Wrist Flexor Stretch with Arm Bent  - 1 x daily -  7 x weekly - 3 sets - 30s hold - Standing Wrist Extension Stretch  - 1 x daily - 7 x weekly - 3 sets - 30s hold - Wrist Flexion with Resistance  - 1 x daily - 7 x weekly - 2-3 sets - 10-12 reps - Wrist Extension with Resistance  - 1 x daily - 7 x weekly - 2-3 sets - 10-12 reps  ASSESSMENT:  CLINICAL IMPRESSION: Continued PT POC focused on chronic R medial epicondylitis. PT focused on continued strengthening of global strengthening and wrist strengthening. She tolerated increased in resistance towards wrist exercises without exacerbation of medial elbow pain. Manual techniques utilized to reduce muscle tension in common flexor/extensor group. She still has notable increased muscle tension in common wrist flexor muscles.  PT will continue to strengthen patients R wrist, elbow and shoulder within her tolerance. She will continue to benefit from skilled physical therapy focused on  graded strengthening, pain modulation, and activity modification in order to reduce further injury of R common extensor/flexor tendon.    OBJECTIVE IMPAIRMENTS: decreased strength, increased edema, increased muscle spasms, and pain.   ACTIVITY LIMITATIONS: carrying, lifting, sleeping, and reach over head  PARTICIPATION LIMITATIONS: meal prep, cleaning, and laundry  PERSONAL FACTORS: Age, Behavior pattern, and Time since onset of injury/illness/exacerbation are also affecting patient's functional outcome.   REHAB POTENTIAL: Good  CLINICAL DECISION MAKING: Stable/uncomplicated  EVALUATION COMPLEXITY: Low   GOALS: Goals reviewed with patient? Yes  SHORT TERM GOALS: Target date: 01/27/2024  Pt will be independent with HEP to improve strength and decrease shoulder pain to improve pain-free function at home and work. Baseline: 12/20/2023: Initial HEP provided Goal status: INITIAL   LONG TERM GOALS: Target date: 02/24/2024   1.  Pt will decrease worst elbow pain by at least 3 points on the NPRS in order to demonstrate clinically significant reduction in shoulder pain. Baseline: 12/20/2023: 9/10 NPS Goal status: INITIAL  2.  Pt will decrease quick DASH score by at least 8% in order to demonstrate clinically significant reduction in disability related to shoulder pain        Baseline: 12/20/2023: 47.7 / 100 = 47.7 % Goal status: INITIAL  3. Pt will increase Wrist Flexion and extension strength by at least 1/2 MMT grade and without pain in order to demonstrate improvement in strength and function         Baseline: 12/20/2023:  Wrist R/L:  Flexion 4* 5  Extension 4* 5   Goal status: INITIAL  3. Pt will report ability to perform one swim bout session without pain in the elbow in order to demonstrate increase in wrist strength and decreased pain.        Baseline: 12/20/2023: Goal status: INITIAL  3. Patient will demonstrate improved grip strength to >=80% of the uninvolved side to  support functional hand use during ADLs and reduce strain on the medial elbow        Baseline: 12/20/2023: To be tested  Goal status: INITIAL   PLAN: PT FREQUENCY: 1-2x/week  PT DURATION: 8 weeks  PLANNED INTERVENTIONS: Therapeutic exercises, Therapeutic activity, Neuromuscular re-education, Balance training, Gait training, Patient/Family education, Self Care, Joint mobilization, Joint manipulation, Vestibular training, Canalith repositioning, Orthotic/Fit training, DME instructions, Dry Needling, Electrical stimulation, Spinal manipulation, Spinal mobilization, Cryotherapy, Moist heat, Taping, Traction, Ultrasound, Ionotophoresis 4mg /ml Dexamethasone, Manual therapy, and Re-evaluation.  PLAN FOR NEXT SESSION: Review HEP; progress Strengthening for elbow, forearm, shoulder    Lonni Pall PT, DPT Physical Therapist- [place] 12/30/2023, 1:44 PM

## 2024-01-03 ENCOUNTER — Ambulatory Visit

## 2024-01-03 DIAGNOSIS — M25521 Pain in right elbow: Secondary | ICD-10-CM

## 2024-01-03 DIAGNOSIS — M6281 Muscle weakness (generalized): Secondary | ICD-10-CM

## 2024-01-03 DIAGNOSIS — M25531 Pain in right wrist: Secondary | ICD-10-CM

## 2024-01-03 NOTE — Therapy (Signed)
 OUTPATIENT PHYSICAL THERAPY SHOULDER/ELBOW TREATMENT  Patient Name: Renee Gomez MRN: [id number] DOB:06-08-1971, 53 y.o., female Today's Date: 01/03/2024  END OF SESSION:  PT End of Session - 01/03/24 0946     Visit Number 5    Number of Visits 17    Date for PT Re-Evaluation 02/14/24    Authorization Type Eval 12/20/23    PT Start Time 0945    PT Stop Time 1030    PT Time Calculation (min) 45 min    Activity Tolerance Patient tolerated treatment well    Behavior During Therapy [facility] for tasks assessed/performed          Past Medical History:  Diagnosis Date   History of kidney stones    Moderate episode of recurrent major depressive disorder (HCC) 03/10/2017   Past Surgical History:  Procedure Laterality Date   APPENDECTOMY     BREAST EXCISIONAL BIOPSY Bilateral 25+yrs ago   neg   BREAST SURGERY     COLONOSCOPY WITH PROPOFOL  N/A 04/24/2019   Procedure: COLONOSCOPY WITH PROPOFOL ;  Surgeon: Unk Corinn Skiff, MD;  Location: [facility] ENDOSCOPY;  Service: Gastroenterology;  Laterality: N/A;   LITHOTRIPSY  1995   MINOR BREAST BIOPSY Bilateral    neg x 2   TOTAL HIP ARTHROPLASTY Right 12/2012   Patient Active Problem List   Diagnosis Date Noted   Medial epicondylitis, left 12/14/2023   Medial epicondylitis, right 11/16/2023   Prediabetes 04/09/2023   Family history of cerebral aneurysm 04/09/2023   Plantar wart of right foot 10/03/2019   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   Vertigo, intermittent 03/15/2018   Low back pain with sciatica 01/18/2015   Fibrocystic breast 01/18/2015   Acid reflux 01/18/2015   Hyperlipidemia, mild 01/18/2015   Knee pain, right 01/18/2015    PCP: Justus Leita DEL, MD   REFERRING PROVIDER: Alvia Selinda PARAS, MD   REFERRING DIAG:  M77.01 (ICD-10-CM) - Medial epicondylitis, right  M77.02 (ICD-10-CM) - Medial epicondylitis, left    RATIONALE FOR EVALUATION AND TREATMENT: Rehabilitation  THERAPY DIAG: Pain in right  elbow  Muscle weakness (generalized)  Pain in right wrist  ONSET DATE: Chronic Elbow Pain  FOLLOW-UP APPT SCHEDULED WITH REFERRING PROVIDER: Yes  SUBJECTIVE:                                                                                                                                                                                         SUBJECTIVE STATEMENT:    Patient reporting to OPPT with a chief concern of Chronic R elbow pain.   PERTINENT HISTORY:   Renee Gomez is a 52 y.o. female reporting  to OPPT with R elbow pain. She reports that the pain worsens with driving, house work, and moving her arm into an outstretched movement. She reports that pain is alleviated with sleeping and rest. She reports that housework has been difficult with gripping objects. She states that her physician has provided prescription medications (meloxicam ) in order to improve the pain.   Activity Level: She uses the exercise bike and swimming as form of exercise. She reports that swimming has minor pain in the elbow.   Dominant hand: right  Imaging:   CLINICAL DATA:  Chronic swelling   EXAM: RIGHT ELBOW - 2 VIEW   COMPARISON:  None Available.   FINDINGS: There is no evidence of fracture, dislocation, or joint effusion. There is no evidence of arthropathy or other focal bone abnormality. Soft tissues are unremarkable.   IMPRESSION: Negative.     Electronically Signed   By: Luke Bun M.D.   On: 11/15/2023 16:09  PAIN:  Pain Intensity: Present: 8/10, Best: 8/10, Worst: 9/10 Pain location: R Medial/Lateral Elbow Pain Quality: constant and sharp  Radiating: Yes in tho Numbness/Tingling: No Focal Weakness: No 24-hour pain behavior: Activity dependent  History of prior shoulder or neck/shoulder injury, pain, surgery, or therapy: Yes  Red flags (personal history of cancer, chills/fever, night sweats, nausea, vomiting, unrelenting pain, unexplained weight gain/loss):  Negative  PRECAUTIONS: Fall  WEIGHT BEARING RESTRICTIONS: No  FALLS: Has patient fallen in last 6 months? 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 )  Cognition WNL    Gross Musculoskeletal Assessment Tremor: None Bulk: Normal Tone: Increased muscle tension along R Wrist flexor group and R Wrist extensor group  Gait Deferred  Posture  AROM AROM (Normal range in degrees) AROM   Right Left  Shoulder    Flexion WNL WNL  Extension    Abduction WNL* WNL  External Rotation    Internal Rotation    Hands Behind Head WNL*  WNL*  Hands Behind Back WNL WNl      Elbow    Flexion WNL* WNL  Extension WNL* WNl  Pronation WNL*    Supination    (* = pain; Blank rows = not tested)  UE MMT: MMT (out of 5) Right Left   Cervical (isometric)  Flexion WNL  Extension WNL  Lateral Flexion WNL WNL  Rotation WNL WNL      Shoulder   Flexion 4 4  Extension    Abduction 4 4  External rotation 5 5  Internal rotation 5 5  Horizontal abduction    Horizontal adduction    Lower Trapezius    Rhomboids        Elbow  Flexion 5 5  Extension 4 4  Pronation 4* 5  Supination 4 5      Wrist  Flexion 4* 5  Extension 4* 5  Radial deviation    Ulnar deviation        MCP  Flexion    Extension    Abduction    Adduction    (* = pain; Blank rows = not  tested)  Sensation Grossly intact to light touch bilateral UE as determined by testing dermatomes C2-T2. Proprioception and hot/cold testing deferred on this date.  Reflexes R/L Biceps (L3/4):  2+/2+  Triceps (S1/2): 2+/2+  Brachioradialis: 2+/2  Palpation Location LEFT  RIGHT           Medial Epicondyle   2  Lateral Epicondyle   1  Wrist Flexor Group   2  Wrist Extensor Group   1  Bicep Muscle   0  Tricep Muscle   0  Flexor Retinaculum   1      (Blank rows = not tested) Graded on 0-4 scale (0 = no pain, 1 = pain, 2 = pain with wincing/grimacing/flinching, 3 = pain with withdrawal, 4 = unwilling to allow palpation), (Blank rows = not tested)   Passive Accessory Intervertebral Motion Deferred   Accessory Motions/Glides  Elbow: Humeroulnar Ulnar Glide: + for pain  Humeroulnar Radial Glide: + for pain and hypomobile throughout     Muscle Length Testing Deferred    SPECIAL TESTS:  Medial Elbow:  Medial Elbow Test (Elbow Extension with passive wrist supination/extension):  R: + For concordant pain and tightness  TODAY'S TREATMENT  DATE: 01/03/24  Subjective: Patient reports 6/10 in the R medial elbow throughout forearm. Patient reports 5/10 in lateral epicondyle. She reports that's she did a lot of house cleaning yesterday but didn't wear the tennis elbow brace; sharp pain noted at the end of the day.   Patient brought tennis elbow brace to session. No questions or concerns.   Therapeutic Exercise:  UBE (Seat 8) - 5 min - 2.5 min Fwd, 2.5 min Retro - Level 8-10 for UE Warm Up, strength and muscular endurance; PT manually adjusted resistance throughout to patient's tolerance.    Resisted Supination/Pronation  R: 2 x 10 ea direction - 4# DB  Wrist Flexion with Resistance - Eccentric Focus   R: 2 x 10, 4# DB   Wrist Extension with Resistance - Eccentric Focus  R: 2 x 10, 4# DB  Grip Strength against Red Ball for increased grip strength  R: 2 x 10 x 3s hold       Therapeutic Activity (Focused on improving grip strength and UE strength in order to optimize cleaning and household tasks at home):   Bilateral Shoulder Flexion Against resistance   2 x  10, 3#DB   Bilateral Shoulder Abduction Against resistance    2 x 10 4# DB  Walking with Pinch Grip on Weighted Plate    3 Laps - 24' - 5#    3 Laps - 24' - 10#  Manual Therapy:  Light STM applied to R wrist flexor and wrist extensor musculature for pain modulation and decrease muscle tension. Myofascial release, trigger point and contract and relax techniques utilized. Pt endorsed improvements in pain following intervention.    R Wrist Flexion Stretch   15s/bout x 2 in order to improve ROM and tissue extensibility  R Wrist Extension Stretch  15s/bout x 2 in order to improve ROM and tissue extensibility    PATIENT EDUCATION:  Education details: HEP, POC, Prognosis  Person educated: Patient Education method: Explanation, Demonstration, and Handouts Education comprehension: verbalized understanding and returned demonstration  HOME EXERCISE PROGRAM:  Access Code: [email or web address] URL: [email or web address] Date: 12/23/2023 Prepared by: Lonni Pall  Exercises - Standing Wrist Flexor Stretch with Arm Bent  - 1 x daily - 7 x weekly - 3 sets - 30s hold - Standing Wrist Extension Stretch  -  1 x daily - 7 x weekly - 3 sets - 30s hold - Wrist Flexion with Resistance  - 1 x daily - 7 x weekly - 2-3 sets - 10-12 reps - Wrist Extension with Resistance  - 1 x daily - 7 x weekly - 2-3 sets - 10-12 reps - Forearm Supination with Resistance  - 1 x daily - 7 x weekly - 2-3 sets - 10-12 reps - Standing Shoulder Row with Anchored Resistance  - 1 x daily - 3-4 x weekly - 2-3 sets - 10-12 reps  Access Code: [email or web address] URL: [email or web address] Date: 12/20/2023 Prepared by: Lonni Pall  Exercises - Standing Wrist Flexor Stretch with Arm Bent  - 1 x daily - 7 x weekly - 3 sets - 30s hold - Standing Wrist Extension Stretch  - 1 x daily - 7 x weekly - 3 sets - 30s hold - Wrist Flexion with Resistance  - 1 x daily - 7 x weekly - 2-3 sets - 10-12 reps - Wrist Extension  with Resistance  - 1 x daily - 7 x weekly - 2-3 sets - 10-12 reps  ASSESSMENT:  CLINICAL IMPRESSION: Continued PT POC focused on chronic R medial epicondylitis. PT focused on continued strengthening of global UE strengthening and wrist strengthening. Activities focused on upward reaching and carrying objects with pinch grip in order to improve grip strength. Good demonstration of walking using pinch grip on 10# plate; no report of additional pain in elbow or wrist. Long lever hammer exercises still provoke pain due to UE weakness; pt educated on resisted sup/pronation using hammer in short lever position.  Manual techniques provided in order to reduce pain and muscle tension. She still presenting with inflammation and pain in the medial elbow limiting her full participation with prolonged household cleaning and iADLs. She will continue to benefit from skilled physical therapy focused on graded strengthening, pain modulation, and activity modification in order to reduce further injury of R common extensor/flexor tendon.    OBJECTIVE IMPAIRMENTS: decreased strength, increased edema, increased muscle spasms, and pain.   ACTIVITY LIMITATIONS: carrying, lifting, sleeping, and reach over head  PARTICIPATION LIMITATIONS: meal prep, cleaning, and laundry  PERSONAL FACTORS: Age, Behavior pattern, and Time since onset of injury/illness/exacerbation are also affecting patient's functional outcome.   REHAB POTENTIAL: Good  CLINICAL DECISION MAKING: Stable/uncomplicated  EVALUATION COMPLEXITY: Low   GOALS: Goals reviewed with patient? Yes  SHORT TERM GOALS: Target date: 01/31/2024  Pt will be independent with HEP to improve strength and decrease shoulder pain to improve pain-free function at home and work. Baseline: 12/20/2023: Initial HEP provided Goal status: INITIAL   LONG TERM GOALS: Target date: 02/28/2024   1.  Pt will decrease worst elbow pain by at least 3 points on the NPRS in order to  demonstrate clinically significant reduction in shoulder pain. Baseline: 12/20/2023: 9/10 NPS Goal status: INITIAL  2.  Pt will decrease quick DASH score by at least 8% in order to demonstrate clinically significant reduction in disability related to shoulder pain        Baseline: 12/20/2023: 47.7 / 100 = 47.7 % Goal status: INITIAL  3. Pt will increase Wrist Flexion and extension strength by at least 1/2 MMT grade and without pain in order to demonstrate improvement in strength and function         Baseline: 12/20/2023:  Wrist R/L:  Flexion 4* 5  Extension 4* 5   Goal status: INITIAL  3. Pt will report ability to perform one  swim bout session without pain in the elbow in order to demonstrate increase in wrist strength and decreased pain.        Baseline: 12/20/2023: Goal status: INITIAL  3. Patient will demonstrate improved grip strength to >=80% of the uninvolved side to support functional hand use during ADLs and reduce strain on the medial elbow        Baseline: 12/20/2023: To be tested  Goal status: INITIAL   PLAN: PT FREQUENCY: 1-2x/week  PT DURATION: 8 weeks  PLANNED INTERVENTIONS: Therapeutic exercises, Therapeutic activity, Neuromuscular re-education, Balance training, Gait training, Patient/Family education, Self Care, Joint mobilization, Joint manipulation, Vestibular training, Canalith repositioning, Orthotic/Fit training, DME instructions, Dry Needling, Electrical stimulation, Spinal manipulation, Spinal mobilization, Cryotherapy, Moist heat, Taping, Traction, Ultrasound, Ionotophoresis 4mg /ml Dexamethasone, Manual therapy, and Re-evaluation.  PLAN FOR NEXT SESSION: Review HEP; progress Strengthening for elbow, forearm, shoulder    Lonni Pall PT, DPT Physical Therapist- [place] 01/03/2024, 10:35 AM

## 2024-01-06 ENCOUNTER — Ambulatory Visit

## 2024-01-06 DIAGNOSIS — M25521 Pain in right elbow: Secondary | ICD-10-CM

## 2024-01-06 DIAGNOSIS — M25531 Pain in right wrist: Secondary | ICD-10-CM

## 2024-01-06 DIAGNOSIS — M6281 Muscle weakness (generalized): Secondary | ICD-10-CM

## 2024-01-06 NOTE — Therapy (Signed)
 OUTPATIENT PHYSICAL THERAPY SHOULDER/ELBOW TREATMENT  Patient Name: Kania Regnier MRN: [id number] DOB:02-17-1971, 53 y.o., female Today's Date: 01/06/2024  END OF SESSION:  PT End of Session - 01/06/24 1302     Visit Number 6    Number of Visits 17    Date for PT Re-Evaluation 02/14/24    Authorization Type Eval 12/20/23    PT Start Time 1301    PT Stop Time 1345    PT Time Calculation (min) 44 min    Activity Tolerance Patient tolerated treatment well    Behavior During Therapy [facility] for tasks assessed/performed           Past Medical History:  Diagnosis Date   History of kidney stones    Moderate episode of recurrent major depressive disorder (HCC) 03/10/2017   Past Surgical History:  Procedure Laterality Date   APPENDECTOMY     BREAST EXCISIONAL BIOPSY Bilateral 25+yrs ago   neg   BREAST SURGERY     COLONOSCOPY WITH PROPOFOL  N/A 04/24/2019   Procedure: COLONOSCOPY WITH PROPOFOL ;  Surgeon: Unk Corinn Skiff, MD;  Location: [facility] END[facility];  Service: Gastroenterology;  Laterality: N/A;   LITHOTRIPSY  1995   MINOR BREAST BIOPSY Bilateral    neg x 2   TOTAL HIP ARTHROPLASTY Right 12/2012   Patient Active Problem List   Diagnosis Date Noted   Medial epicondylitis, left 12/14/2023   Medial epicondylitis, right 11/16/2023   Prediabetes 04/09/2023   Family history of cerebral aneurysm 04/09/2023   Plantar wart of right foot 10/03/2019   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   Vertigo, intermittent 03/15/2018   Low back pain with sciatica 01/18/2015   Fibrocystic breast 01/18/2015   Acid reflux 01/18/2015   Hyperlipidemia, mild 01/18/2015   Knee pain, right 01/18/2015    PCP: Justus Leita DEL, MD   REFERRING PROVIDER: Alvia Selinda PARAS, MD   REFERRING DIAG:  M77.01 (ICD-10-CM) - Medial epicondylitis, right  M77.02 (ICD-10-CM) - Medial epicondylitis, left    RATIONALE FOR EVALUATION AND TREATMENT: Rehabilitation  THERAPY DIAG: Pain in right  elbow  Muscle weakness (generalized)  Pain in right wrist  ONSET DATE: Chronic Elbow Pain  FOLLOW-UP APPT SCHEDULED WITH REFERRING PROVIDER: Yes  SUBJECTIVE:                                                                                                                                                                                         SUBJECTIVE STATEMENT:    Patient reporting to OPPT with a chief concern of Chronic R elbow pain.   PERTINENT HISTORY:   Ms. Journiee Feldkamp Economos is a 53 y.o. female  reporting to OPPT with R elbow pain. She reports that the pain worsens with driving, house work, and moving her arm into an outstretched movement. She reports that pain is alleviated with sleeping and rest. She reports that housework has been difficult with gripping objects. She states that her physician has provided prescription medications (meloxicam ) in order to improve the pain.   Activity Level: She uses the exercise bike and swimming as form of exercise. She reports that swimming has minor pain in the elbow.   Dominant hand: right  Imaging:   CLINICAL DATA:  Chronic swelling   EXAM: RIGHT ELBOW - 2 VIEW   COMPARISON:  None Available.   FINDINGS: There is no evidence of fracture, dislocation, or joint effusion. There is no evidence of arthropathy or other focal bone abnormality. Soft tissues are unremarkable.   IMPRESSION: Negative.     Electronically Signed   By: Luke Bun M.D.   On: 11/15/2023 16:09  PAIN:  Pain Intensity: Present: 8/10, Best: 8/10, Worst: 9/10 Pain location: R Medial/Lateral Elbow Pain Quality: constant and sharp  Radiating: Yes in tho Numbness/Tingling: No Focal Weakness: No 24-hour pain behavior: Activity dependent  History of prior shoulder or neck/shoulder injury, pain, surgery, or therapy: Yes  Red flags (personal history of cancer, chills/fever, night sweats, nausea, vomiting, unrelenting pain, unexplained weight gain/loss):  Negative  PRECAUTIONS: Fall  WEIGHT BEARING RESTRICTIONS: No  FALLS: Has patient fallen in last 6 months? 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 )  Cognition WNL    Gross Musculoskeletal Assessment Tremor: None Bulk: Normal Tone: Increased muscle tension along R Wrist flexor group and R Wrist extensor group  Gait Deferred  Posture  AROM AROM (Normal range in degrees) AROM   Right Left  Shoulder    Flexion WNL WNL  Extension    Abduction WNL* WNL  External Rotation    Internal Rotation    Hands Behind Head WNL*  WNL*  Hands Behind Back WNL WNl      Elbow    Flexion WNL* WNL  Extension WNL* WNl  Pronation WNL*    Supination    (* = pain; Blank rows = not tested)  UE MMT: MMT (out of 5) Right Left   Cervical (isometric)  Flexion WNL  Extension WNL  Lateral Flexion WNL WNL  Rotation WNL WNL      Shoulder   Flexion 4 4  Extension    Abduction 4 4  External rotation 5 5  Internal rotation 5 5  Horizontal abduction    Horizontal adduction    Lower Trapezius    Rhomboids        Elbow  Flexion 5 5  Extension 4 4  Pronation 4* 5  Supination 4 5      Wrist  Flexion 4* 5  Extension 4* 5  Radial deviation    Ulnar deviation        MCP  Flexion    Extension    Abduction    Adduction    (* = pain; Blank rows =  not tested)  Sensation Grossly intact to light touch bilateral UE as determined by testing dermatomes C2-T2. Proprioception and hot/cold testing deferred on this date.  Reflexes R/L Biceps (L3/4):  2+/2+  Triceps (S1/2): 2+/2+  Brachioradialis: 2+/2  Palpation Location LEFT  RIGHT           Medial Epicondyle   2  Lateral Epicondyle   1  Wrist Flexor Group   2  Wrist Extensor Group   1  Bicep Muscle   0  Tricep Muscle   0  Flexor Retinaculum   1      (Blank rows = not tested) Graded on 0-4 scale (0 = no pain, 1 = pain, 2 = pain with wincing/grimacing/flinching, 3 = pain with withdrawal, 4 = unwilling to allow palpation), (Blank rows = not tested)   Passive Accessory Intervertebral Motion Deferred   Accessory Motions/Glides  Elbow: Humeroulnar Ulnar Glide: + for pain  Humeroulnar Radial Glide: + for pain and hypomobile throughout     Muscle Length Testing Deferred    SPECIAL TESTS:  Medial Elbow:  Medial Elbow Test (Elbow Extension with passive wrist supination/extension):  R: + For concordant pain and tightness  TODAY'S TREATMENT  DATE: 01/06/24  Subjective: Patient reports 6/10 NPS in the medial epicondyle and 4/10 in the lateral epicondyle. Patient with tennis elbow brace. Patient reports that she went swimming yesterday and her arms were sore but no sharp pain. No questions or concerns.   Therapeutic Exercise:  UBE (Seat 8) - 5 min - 2.5 min Fwd, 2.5 min Retro - Level 8-10 for UE Warm Up, strength and muscular endurance; PT manually adjusted resistance throughout to patient's tolerance.    Seated Elbow Flexion - Pronated Grip    2 x 10 - 4# DB  Resisted Supination/Pronation  R: 1 x 10 ea direction 4# DB, 1 x 10 5# DB   Wrist Flexion with Resistance - Eccentric Focus   R: 2 x 10, 4# DB   Wrist Extension with Resistance - Eccentric Focus  R: 2 x 10, 4# DB   Therapeutic Activity (Focused on improving grip strength and UE strength in order to optimize cleaning, lifting and carrying tasks at home):    Bilateral Shoulder Front Raise using KB    2 x 10, 10# KB    Suitcase Carry with KB   RUE : 3 Laps - 50' - 10# KB    Walking with Pinch Grip on  Weighted Plate    RUE: 1 Lap - 50' with 10# Plate   Manual Therapy:  Light STM applied to R wrist flexor and wrist extensor musculature for pain modulation and decrease muscle tension. Myofascial release, trigger point and contract and relax techniques utilized. Pt endorsed improvements in pain following intervention.    R Wrist Flexion Stretch   30s/bout x 3 in order to improve ROM and tissue extensibility  R Wrist Extension Stretch for the flexor group - Bent elbow   30s/bout x 2 in order to improve ROM and tissue extensibility    PATIENT EDUCATION:  Education details: HEP, POC, Prognosis  Person educated: Patient Education method: Explanation, Demonstration, and Handouts Education comprehension: verbalized understanding and returned demonstration  HOME EXERCISE PROGRAM:  Access Code: [email or web address] URL: [email or web address] Date: 12/23/2023 Prepared by: Lonni Pall  Exercises - Standing Wrist Flexor Stretch with Arm Bent  - 1 x daily - 7 x weekly - 3 sets - 30s hold - Standing Wrist Extension Stretch  - 1 x daily -  7 x weekly - 3 sets - 30s hold - Wrist Flexion with Resistance  - 1 x daily - 7 x weekly - 2-3 sets - 10-12 reps - Wrist Extension with Resistance  - 1 x daily - 7 x weekly - 2-3 sets - 10-12 reps - Forearm Supination with Resistance  - 1 x daily - 7 x weekly - 2-3 sets - 10-12 reps - Standing Shoulder Row with Anchored Resistance  - 1 x daily - 3-4 x weekly - 2-3 sets - 10-12 reps  Access Code: [email or web address] URL: [email or web address] Date: 12/20/2023 Prepared by: Lonni Pall  Exercises - Standing Wrist Flexor Stretch with Arm Bent  - 1 x daily - 7 x weekly - 3 sets - 30s hold - Standing Wrist Extension Stretch  - 1 x daily - 7 x weekly - 3 sets - 30s hold - Wrist Flexion with Resistance  - 1 x daily - 7 x weekly - 2-3 sets - 10-12 reps - Wrist Extension with Resistance  - 1 x daily - 7 x weekly - 2-3 sets - 10-12  reps  ASSESSMENT:  CLINICAL IMPRESSION: Continued PT POC focused on chronic R medial epicondylitis. Good tolerance to increased resistance with wrist exercises. PT focused on patient's grip strength with kettlebell and weighted plate carry. Her pain is progressively improving and has now localized to just the medial epicondyle.  Manual techniques provided in order to reduce pain and muscle tension. Wrist extension stretch with elbow bent due to tensile pain in the common flexor tendon attachment. She still presenting with inflammation and pain in the medial elbow limiting her full participation with prolonged household cleaning and iADLs. She will continue to benefit from skilled physical therapy focused on graded strengthening, pain modulation, and activity modification in order to reduce further injury of R common extensor/flexor tendon.    OBJECTIVE IMPAIRMENTS: decreased strength, increased edema, increased muscle spasms, and pain.   ACTIVITY LIMITATIONS: carrying, lifting, sleeping, and reach over head  PARTICIPATION LIMITATIONS: meal prep, cleaning, and laundry  PERSONAL FACTORS: Age, Behavior pattern, and Time since onset of injury/illness/exacerbation are also affecting patient's functional outcome.   REHAB POTENTIAL: Good  CLINICAL DECISION MAKING: Stable/uncomplicated  EVALUATION COMPLEXITY: Low   GOALS: Goals reviewed with patient? Yes  SHORT TERM GOALS: Target date: 02/03/2024  Pt will be independent with HEP to improve strength and decrease shoulder pain to improve pain-free function at home and work. Baseline: 12/20/2023: Initial HEP provided Goal status: INITIAL   LONG TERM GOALS: Target date: 03/02/2024   1.  Pt will decrease worst elbow pain by at least 3 points on the NPRS in order to demonstrate clinically significant reduction in shoulder pain. Baseline: 12/20/2023: 9/10 NPS Goal status: INITIAL  2.  Pt will decrease quick DASH score by at least 8% in order to  demonstrate clinically significant reduction in disability related to shoulder pain        Baseline: 12/20/2023: 47.7 / 100 = 47.7 % Goal status: INITIAL  3. Pt will increase Wrist Flexion and extension strength by at least 1/2 MMT grade and without pain in order to demonstrate improvement in strength and function         Baseline: 12/20/2023:  Wrist R/L:  Flexion 4* 5  Extension 4* 5   Goal status: INITIAL  3. Pt will report ability to perform one swim bout session without pain in the elbow in order to demonstrate increase in wrist strength and decreased pain.  Baseline: 12/20/2023: Goal status: INITIAL   3. Patient will demonstrate improved grip strength to >=80% of the uninvolved side to support functional hand use during ADLs and reduce strain on the medial elbow        Baseline: 12/20/2023: To be tested  Goal status: INITIAL   PLAN: PT FREQUENCY: 1-2x/week  PT DURATION: 8 weeks  PLANNED INTERVENTIONS: Therapeutic exercises, Therapeutic activity, Neuromuscular re-education, Balance training, Gait training, Patient/Family education, Self Care, Joint mobilization, Joint manipulation, Vestibular training, Canalith repositioning, Orthotic/Fit training, DME instructions, Dry Needling, Electrical stimulation, Spinal manipulation, Spinal mobilization, Cryotherapy, Moist heat, Taping, Traction, Ultrasound, Ionotophoresis 4mg /ml Dexamethasone, Manual therapy, and Re-evaluation.  PLAN FOR NEXT SESSION: Review HEP; progress Strengthening for elbow, forearm, shoulder    Lonni Pall PT, DPT Physical Therapist- [place] 01/06/2024, 1:49 PM

## 2024-01-10 ENCOUNTER — Ambulatory Visit: Attending: Family Medicine

## 2024-01-10 DIAGNOSIS — M6281 Muscle weakness (generalized): Secondary | ICD-10-CM | POA: Diagnosis present

## 2024-01-10 DIAGNOSIS — M25531 Pain in right wrist: Secondary | ICD-10-CM | POA: Insufficient documentation

## 2024-01-10 DIAGNOSIS — M25521 Pain in right elbow: Secondary | ICD-10-CM | POA: Diagnosis present

## 2024-01-10 NOTE — Therapy (Signed)
 OUTPATIENT PHYSICAL THERAPY SHOULDER/ELBOW TREATMENT  Patient Name: Renee Gomez MRN: [id number] DOB:Apr 08, 1971, 53 y.o., female Today's Date: 01/10/2024  END OF SESSION:  PT End of Session - 01/10/24 0947     Visit Number 7    Number of Visits 17    Date for PT Re-Evaluation 02/14/24    Authorization Type Eval 12/20/23    PT Start Time 0946    PT Stop Time 1030    PT Time Calculation (min) 44 min    Activity Tolerance Patient tolerated treatment well    Behavior During Therapy [facility] for tasks assessed/performed           Past Medical History:  Diagnosis Date   History of kidney stones    Moderate episode of recurrent major depressive disorder (HCC) 03/10/2017   Past Surgical History:  Procedure Laterality Date   APPENDECTOMY     BREAST EXCISIONAL BIOPSY Bilateral 25+yrs ago   neg   BREAST SURGERY     COLONOSCOPY WITH PROPOFOL  N/A 04/24/2019   Procedure: COLONOSCOPY WITH PROPOFOL ;  Surgeon: Unk Corinn Skiff, MD;  Location: [facility] ENDOSCOPY;  Service: Gastroenterology;  Laterality: N/A;   LITHOTRIPSY  1995   MINOR BREAST BIOPSY Bilateral    neg x 2   TOTAL HIP ARTHROPLASTY Right 12/2012   Patient Active Problem List   Diagnosis Date Noted   Medial epicondylitis, left 12/14/2023   Medial epicondylitis, right 11/16/2023   Prediabetes 04/09/2023   Family history of cerebral aneurysm 04/09/2023   Plantar wart of right foot 10/03/2019   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   Vertigo, intermittent 03/15/2018   Low back pain with sciatica 01/18/2015   Fibrocystic breast 01/18/2015   Acid reflux 01/18/2015   Hyperlipidemia, mild 01/18/2015   Knee pain, right 01/18/2015    PCP: Renee Leita DEL, MD   REFERRING PROVIDER: Alvia Selinda PARAS, MD   REFERRING DIAG:  M77.01 (ICD-10-CM) - Medial epicondylitis, right  M77.02 (ICD-10-CM) - Medial epicondylitis, left    RATIONALE FOR EVALUATION AND TREATMENT: Rehabilitation  THERAPY DIAG: Pain in right  elbow  Muscle weakness (generalized)  Pain in right wrist  ONSET DATE: Chronic Elbow Pain  FOLLOW-UP APPT SCHEDULED WITH REFERRING PROVIDER: Yes  SUBJECTIVE:                                                                                                                                                                                         SUBJECTIVE STATEMENT:    Patient reporting to OPPT with a chief concern of Chronic R elbow pain.   PERTINENT HISTORY:   [name] is a 53 y.o. female  reporting to OPPT with R elbow pain. She reports that the pain worsens with driving, house work, and moving her arm into an outstretched movement. She reports that pain is alleviated with sleeping and rest. She reports that housework has been difficult with gripping objects. She states that her physician has provided prescription medications (meloxicam ) in order to improve the pain.   Activity Level: She uses the exercise bike and swimming as form of exercise. She reports that swimming has minor pain in the elbow.   Dominant hand: right  Imaging:   CLINICAL DATA:  Chronic swelling   EXAM: RIGHT ELBOW - 2 VIEW   COMPARISON:  None Available.   FINDINGS: There is no evidence of fracture, dislocation, or joint effusion. There is no evidence of arthropathy or other focal bone abnormality. Soft tissues are unremarkable.   IMPRESSION: Negative.     Electronically Signed   By: Renee Gomez M.D.   On: 11/15/2023 16:09  PAIN:  Pain Intensity: Present: 8/10, Best: 8/10, Worst: 9/10 Pain location: R Medial/Lateral Elbow Pain Quality: constant and sharp  Radiating: Yes in tho Numbness/Tingling: No Focal Weakness: No 24-hour pain behavior: Activity dependent  History of prior shoulder or neck/shoulder injury, pain, surgery, or therapy: Yes  Red flags (personal history of cancer, chills/fever, night sweats, nausea, vomiting, unrelenting pain, unexplained weight gain/loss):  Negative  PRECAUTIONS: Fall  WEIGHT BEARING RESTRICTIONS: No  FALLS: Has patient fallen in last 6 months? 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 )  Cognition WNL    Gross Musculoskeletal Assessment Tremor: None Bulk: Normal Tone: Increased muscle tension along R Wrist flexor group and R Wrist extensor group  Gait Deferred  Posture  AROM AROM (Normal range in degrees) AROM   Right Left  Shoulder    Flexion WNL WNL  Extension    Abduction WNL* WNL  External Rotation    Internal Rotation    Hands Behind Head WNL*  WNL*  Hands Behind Back WNL WNl      Elbow    Flexion WNL* WNL  Extension WNL* WNl  Pronation WNL*    Supination    (* = pain; Blank rows = not tested)  UE MMT: MMT (out of 5) Right Left   Cervical (isometric)  Flexion WNL  Extension WNL  Lateral Flexion WNL WNL  Rotation WNL WNL      Shoulder   Flexion 4 4  Extension    Abduction 4 4  External rotation 5 5  Internal rotation 5 5  Horizontal abduction    Horizontal adduction    Lower Trapezius    Rhomboids        Elbow  Flexion 5 5  Extension 4 4  Pronation 4* 5  Supination 4 5      Wrist  Flexion 4* 5  Extension 4* 5  Radial deviation    Ulnar deviation        MCP  Flexion    Extension    Abduction    Adduction    (* = pain; Blank rows =  not tested)  Sensation Grossly intact to light touch bilateral UE as determined by testing dermatomes C2-T2. Proprioception and hot/cold testing deferred on this date.  Reflexes R/L Biceps (L3/4):  2+/2+  Triceps (S1/2): 2+/2+  Brachioradialis: 2+/2  Palpation Location LEFT  RIGHT           Medial Epicondyle   2  Lateral Epicondyle   1  Wrist Flexor Group   2  Wrist Extensor Group   1  Bicep Muscle   0  Tricep Muscle   0  Flexor Retinaculum   1      (Blank rows = not tested) Graded on 0-4 scale (0 = no pain, 1 = pain, 2 = pain with wincing/grimacing/flinching, 3 = pain with withdrawal, 4 = unwilling to allow palpation), (Blank rows = not tested)   Passive Accessory Intervertebral Motion Deferred   Accessory Motions/Glides  Elbow: Humeroulnar Ulnar Glide: + for pain  Humeroulnar Radial Glide: + for pain and hypomobile throughout     Muscle Length Testing Deferred    SPECIAL TESTS:  Medial Elbow:  Medial Elbow Test (Elbow Extension with passive wrist supination/extension):  R: + For concordant pain and tightness  TODAY'S TREATMENT  DATE: 01/10/24  Subjective: Patient reports 5/10 NPS in the R medial elbow.  Patient reports that her L elbow is beginning to have increasing pain. Arrived to OPPT with elbow brace donned. No questions or concerns.   Therapeutic Exercise:   Seated Elbow Flexion - Pronated Grip    R/L: 3 x 10 - 4# DB  Resisted Supination/Pronation  R: 2 x 10 5# DB   Wrist Flexion with Resistance - Elbow unsupported - neutral beside waist  R: 2 x 10, 4# DB   Wrist Extension with Resistance - Elbow unsupported - neutral beside waist  R: 2 x 10, 4# DB   Therapeutic Activity (Focused on improving grip strength and UE strength in order to optimize cleaning, lifting and carrying tasks at home):    Bending > Lifting from 8 stool > Carrying -10' to chair  8# Dumbbell both arm    4 Laps   Walking with Pinch Grip on Weighted Plate  RUE: 3 Laps - 50' with 10# Plate   Lifting and Reach with Resistance to various targets at different heights on wall (target called by PT)   1 x 10 - 5# DB  2 x 10 - 3 Kg MB  Manual Therapy (8 min):  Moderate  STM applied to R wrist flexor and wrist extensor musculature for pain modulation and decrease muscle tension. Myofascial release, trigger point and contract and relax techniques utilized. Pt endorsed improvements in pain following intervention.    R Wrist Flexion Stretch - Elbow Extended   30s/bout x 3 in order to improve ROM and tissue extensibility   PATIENT EDUCATION:  Education details: HEP, POC, Prognosis  Person educated: Patient Education method: Explanation, Demonstration, and Handouts Education comprehension: verbalized understanding and returned demonstration  HOME EXERCISE PROGRAM:  Access Code: [email or web address] URL: [email or web address] Date: 12/23/2023 Prepared by: Lonni Pall  Exercises - Standing Wrist Flexor Stretch with Arm Bent  - 1 x daily - 7 x weekly - 3 sets - 30s hold - Standing Wrist Extension Stretch  - 1 x daily - 7 x weekly - 3 sets - 30s hold - Wrist Flexion with Resistance  - 1 x daily - 7 x weekly - 2-3 sets - 10-12 reps - Wrist Extension with Resistance  - 1 x daily -  7 x weekly - 2-3 sets - 10-12 reps - Forearm Supination with Resistance  - 1 x daily - 7 x weekly - 2-3 sets - 10-12 reps - Standing Shoulder Row with Anchored Resistance  - 1 x daily - 3-4 x weekly - 2-3 sets - 10-12 reps  Access Code: [email or web address] URL: [email or web address] Date: 12/20/2023 Prepared by: Lonni Pall  Exercises - Standing Wrist Flexor Stretch with Arm Bent  - 1 x daily - 7 x weekly - 3 sets - 30s hold - Standing Wrist Extension Stretch  - 1 x daily - 7 x weekly - 3 sets - 30s hold - Wrist Flexion with Resistance  - 1 x daily - 7 x weekly - 2-3 sets - 10-12 reps - Wrist Extension with Resistance  - 1 x daily - 7 x weekly - 2-3 sets - 10-12 reps  ASSESSMENT:  CLINICAL IMPRESSION: Continued PT POC focused on chronic R medial epicondylitis. Good tolerance to resistive exercises to forearm musculature. Pt able to progress from bolster support to  exercises with elbow positioned at waist. Poor technique with lifting mechanics from the floor; unable to pick up weighted box without report of lower back pain. Notable weakness in bilateral LE with picking up dumbbells form 8 stool. Patient reports that she is able to perform more household ADLs without pain when using tennis elbow brace. Her pain is responding well to PT interventions and adherence to HEP. PT encouraged activity modification with household chores. She still presenting with inflammation and pain in the medial elbow limiting her full participation with prolonged household cleaning and iADLs. She will continue to benefit from skilled physical therapy focused on graded strengthening, pain modulation, and activity modification in order to reduce further injury of R common extensor/flexor tendon.    OBJECTIVE IMPAIRMENTS: decreased strength, increased edema, increased muscle spasms, and pain.   ACTIVITY LIMITATIONS: carrying, lifting, sleeping, and reach over head  PARTICIPATION LIMITATIONS: meal prep, cleaning, and laundry  PERSONAL FACTORS: Age, Behavior pattern, and Time since onset of injury/illness/exacerbation are also affecting patient's functional outcome.   REHAB POTENTIAL: Good  CLINICAL DECISION MAKING: Stable/uncomplicated  EVALUATION COMPLEXITY: Low   GOALS: Goals reviewed with patient? Yes  SHORT TERM GOALS: Target date: 02/07/2024  Pt will be independent with HEP to improve strength and decrease shoulder pain to improve pain-free function at home and work. Baseline: 12/20/2023: Initial HEP provided Goal status: INITIAL   LONG TERM GOALS: Target date: 03/06/2024   1.  Pt will decrease worst elbow pain by at least 3 points on the NPRS in order to demonstrate clinically significant reduction in shoulder pain. Baseline: 12/20/2023: 9/10 NPS Goal status: INITIAL  2.  Pt will decrease quick DASH score by at least 8% in order to demonstrate clinically significant  reduction in disability related to shoulder pain        Baseline: 12/20/2023: 47.7 / 100 = 47.7 % Goal status: INITIAL  3. Pt will increase Wrist Flexion and extension strength by at least 1/2 MMT grade and without pain in order to demonstrate improvement in strength and function         Baseline: 12/20/2023:  Wrist R/L:  Flexion 4* 5  Extension 4* 5   Goal status: INITIAL  3. Pt will report ability to perform one swim bout session without pain in the elbow in order to demonstrate increase in wrist strength and decreased pain.        Baseline: 12/20/2023: Goal status: INITIAL   3. Patient  will demonstrate improved grip strength to >=80% of the uninvolved side to support functional hand use during ADLs and reduce strain on the medial elbow        Baseline: 12/20/2023: To be tested  Goal status: INITIAL   PLAN: PT FREQUENCY: 1-2x/week  PT DURATION: 8 weeks  PLANNED INTERVENTIONS: Therapeutic exercises, Therapeutic activity, Neuromuscular re-education, Balance training, Gait training, Patient/Family education, Self Care, Joint mobilization, Joint manipulation, Vestibular training, Canalith repositioning, Orthotic/Fit training, DME instructions, Dry Needling, Electrical stimulation, Spinal manipulation, Spinal mobilization, Cryotherapy, Moist heat, Taping, Traction, Ultrasound, Ionotophoresis 4mg /ml Dexamethasone, Manual therapy, and Re-evaluation.  PLAN FOR NEXT SESSION: Review HEP; progress Strengthening for elbow, forearm, shoulder    Lonni Pall PT, DPT Physical Therapist- [place] 01/10/2024, 10:40 AM

## 2024-01-13 ENCOUNTER — Ambulatory Visit

## 2024-01-13 DIAGNOSIS — M25521 Pain in right elbow: Secondary | ICD-10-CM | POA: Diagnosis not present

## 2024-01-13 DIAGNOSIS — M6281 Muscle weakness (generalized): Secondary | ICD-10-CM

## 2024-01-13 NOTE — Therapy (Signed)
 OUTPATIENT PHYSICAL THERAPY SHOULDER/ELBOW TREATMENT  Patient Name: Renee Gomez MRN: [id number] DOB:Jan 09, 1971, 53 y.o., female Today's Date: 01/13/2024  END OF SESSION:  PT End of Session - 01/13/24 1116     Visit Number 8    Number of Visits 17    Date for PT Re-Evaluation 02/14/24    Authorization Type Eval 12/20/23    PT Start Time 1115    PT Stop Time 1155    PT Time Calculation (min) 40 min    Activity Tolerance Patient tolerated treatment well    Behavior During Therapy [facility] for tasks assessed/performed           Past Medical History:  Diagnosis Date   History of kidney stones    Moderate episode of recurrent major depressive disorder (HCC) 03/10/2017   Past Surgical History:  Procedure Laterality Date   APPENDECTOMY     BREAST EXCISIONAL BIOPSY Bilateral 25+yrs ago   neg   BREAST SURGERY     COLONOSCOPY WITH PROPOFOL  N/A 04/24/2019   Procedure: COLONOSCOPY WITH PROPOFOL ;  Surgeon: Unk Corinn Skiff, MD;  Location: [facility] ENDOSCOPY;  Service: Gastroenterology;  Laterality: N/A;   LITHOTRIPSY  1995   MINOR BREAST BIOPSY Bilateral    neg x 2   TOTAL HIP ARTHROPLASTY Right 12/2012   Patient Active Problem List   Diagnosis Date Noted   Medial epicondylitis, left 12/14/2023   Medial epicondylitis, right 11/16/2023   Prediabetes 04/09/2023   Family history of cerebral aneurysm 04/09/2023   Plantar wart of right foot 10/03/2019   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   Vertigo, intermittent 03/15/2018   Low back pain with sciatica 01/18/2015   Fibrocystic breast 01/18/2015   Acid reflux 01/18/2015   Hyperlipidemia, mild 01/18/2015   Knee pain, right 01/18/2015    PCP: Justus Leita DEL, MD   REFERRING PROVIDER: Alvia Selinda PARAS, MD   REFERRING DIAG:  M77.01 (ICD-10-CM) - Medial epicondylitis, right  M77.02 (ICD-10-CM) - Medial epicondylitis, left    RATIONALE FOR EVALUATION AND TREATMENT: Rehabilitation  THERAPY DIAG: Pain in right  elbow  Muscle weakness (generalized)  ONSET DATE: Chronic Elbow Pain  FOLLOW-UP APPT SCHEDULED WITH REFERRING PROVIDER: Yes  SUBJECTIVE:                                                                                                                                                                                         SUBJECTIVE STATEMENT:    Patient reporting to OPPT with a chief concern of Chronic R elbow pain.   PERTINENT HISTORY:   Renee Gomez is a 53 y.o. female reporting to OPPT with R  elbow pain. She reports that the pain worsens with driving, house work, and moving her arm into an outstretched movement. She reports that pain is alleviated with sleeping and rest. She reports that housework has been difficult with gripping objects. She states that her physician has provided prescription medications (meloxicam ) in order to improve the pain.   Activity Level: She uses the exercise bike and swimming as form of exercise. She reports that swimming has minor pain in the elbow.   Dominant hand: right  Imaging:   CLINICAL DATA:  Chronic swelling   EXAM: RIGHT ELBOW - 2 VIEW   COMPARISON:  None Available.   FINDINGS: There is no evidence of fracture, dislocation, or joint effusion. There is no evidence of arthropathy or other focal bone abnormality. Soft tissues are unremarkable.   IMPRESSION: Negative.     Electronically Signed   By: Luke Bun M.D.   On: 11/15/2023 16:09  PAIN:  Pain Intensity: Present: 8/10, Best: 8/10, Worst: 9/10 Pain location: R Medial/Lateral Elbow Pain Quality: constant and sharp  Radiating: Yes in tho Numbness/Tingling: No Focal Weakness: No 24-hour pain behavior: Activity dependent  History of prior shoulder or neck/shoulder injury, pain, surgery, or therapy: Yes  Red flags (personal history of cancer, chills/fever, night sweats, nausea, vomiting, unrelenting pain, unexplained weight gain/loss): Negative  PRECAUTIONS:  Fall  WEIGHT BEARING RESTRICTIONS: No  FALLS: Has patient fallen in last 6 months? 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 )  Cognition WNL    Gross Musculoskeletal Assessment Tremor: None Bulk: Normal Tone: Increased muscle tension along R Wrist flexor group and R Wrist extensor group  Gait Deferred  Posture  AROM AROM (Normal range in degrees) AROM   Right Left  Shoulder    Flexion WNL WNL  Extension    Abduction WNL* WNL  External Rotation    Internal Rotation    Hands Behind Head WNL*  WNL*  Hands Behind Back WNL WNl      Elbow    Flexion WNL* WNL  Extension WNL* WNl  Pronation WNL*    Supination    (* = pain; Blank rows = not tested)  UE MMT: MMT (out of 5) Right Left   Cervical (isometric)  Flexion WNL  Extension WNL  Lateral Flexion WNL WNL  Rotation WNL WNL      Shoulder   Flexion 4 4  Extension    Abduction 4 4  External rotation 5 5  Internal rotation 5 5  Horizontal abduction    Horizontal adduction    Lower Trapezius    Rhomboids        Elbow  Flexion 5 5  Extension 4 4  Pronation 4* 5  Supination 4 5      Wrist  Flexion 4* 5  Extension 4* 5  Radial deviation    Ulnar deviation        MCP  Flexion    Extension    Abduction    Adduction    (* = pain; Blank rows = not tested)  Sensation Grossly  intact to light touch bilateral UE as determined by testing dermatomes C2-T2. Proprioception and hot/cold testing deferred on this date.  Reflexes R/L Biceps (L3/4): 2+/2+  Triceps (S1/2):  2+/2+  Brachioradialis: 2+/2  Palpation Location LEFT  RIGHT           Medial Epicondyle   2  Lateral Epicondyle   1  Wrist Flexor Group   2  Wrist Extensor Group   1  Bicep Muscle   0  Tricep Muscle   0  Flexor Retinaculum   1      (Blank rows = not tested) Graded on 0-4 scale (0 = no pain, 1 = pain, 2 = pain with wincing/grimacing/flinching, 3 = pain with withdrawal, 4 = unwilling to allow palpation), (Blank rows = not tested)   Passive Accessory Intervertebral Motion Deferred   Accessory Motions/Glides  Elbow: Humeroulnar Ulnar Glide: + for pain  Humeroulnar Radial Glide: + for pain and hypomobile throughout     Muscle Length Testing Deferred    SPECIAL TESTS:  Medial Elbow:  Medial Elbow Test (Elbow Extension with passive wrist supination/extension):  R: + For concordant pain and tightness  TODAY'S TREATMENT  DATE: 01/13/24  Subjective: Patient reports 5/10 NPS in the R medial elbow. Patient reports lower back pain following the last session. Patient reports that her L elbow is getting worse. . No questions or concerns.   Therapeutic Exercise:   Seated Elbow Flexion - Pronated Grip    R/L: 3 x 10 - 4# DB  Resisted Supination/Pronation  R: 2 x 10 5# DB   Wrist Flexion with Resistance   R: 1 x 10, 4# DB - Elbow unsupported - neutral beside waist  R: 1 x 10, 5# DB - Elbow supported by bolster  Wrist Extension with Resistance - Elbow unsupported - neutral beside waist  R: 1 x 10, 4# DB - Elbow unsupported - neutral beside waist  R: 1 x 10, 5# DB - Elbow supported by bolster  Seated Lat Pull Down   1 x 10 - 15#  2 x 10 - 25#  Seated Shoulder Row  3 x 10 - 10#     Therapeutic Activity (Focused on improving grip strength and UE strength in order to optimize cleaning, lifting and carrying tasks at home): Bilateral Elbow Curl - 8# weighted dowel (from sled)   3 x 10 - VC to refrain from complete elbow extension.   Suitcase Carry - 10# KB  4 x 15'  with weight in the RUE    Manual Therapy (8 min):  Moderate STM applied to R wrist flexor and wrist extensor musculature for pain modulation and decrease muscle tension. Myofascial release, trigger point and contract and relax techniques utilized. Pt endorsed improvements in pain following intervention.    R Wrist Flexion Stretch - Elbow Extended   30s/bout x 3 in order to improve pain and tissue extensibility  R Wrist Extensors Stretch - Elbow Extended   30s/bout x 3 in order to improve pain and tissue extensibility  - Patient's muscle tension in R forearm flexors with decreased muscle tension in this session; PT encouraged continued adherence to stretch protocol.   PATIENT EDUCATION:  Education details: HEP, POC, Prognosis  Person educated: Patient Education method: Explanation, Demonstration, and Handouts Education comprehension: verbalized understanding and returned demonstration  HOME EXERCISE PROGRAM:  Access Code: [email or web address] URL: [email or web address] Date: 12/23/2023 Prepared by: Lonni Pall  Exercises - Standing Wrist Flexor Stretch with Arm Bent  - 1 x  daily - 7 x weekly - 3 sets - 30s hold - Standing Wrist Extension Stretch  - 1 x daily - 7 x weekly - 3 sets - 30s hold - Wrist Flexion with Resistance  - 1 x daily - 7 x weekly - 2-3 sets - 10-12 reps - Wrist Extension with Resistance  - 1 x daily - 7 x weekly - 2-3 sets - 10-12 reps - Forearm Supination with Resistance  - 1 x daily - 7 x weekly - 2-3 sets - 10-12 reps - Standing Shoulder Row with Anchored Resistance  - 1 x daily - 3-4 x weekly - 2-3 sets - 10-12 reps  Access Code: [email or web address] URL: [email or web address] Date: 12/20/2023 Prepared by: Lonni Pall  Exercises - Standing Wrist Flexor Stretch with Arm Bent  - 1 x daily - 7 x weekly - 3 sets - 30s hold - Standing Wrist Extension Stretch  - 1 x daily - 7 x weekly - 3 sets - 30s hold - Wrist Flexion with Resistance  - 1 x daily  - 7 x weekly - 2-3 sets - 10-12 reps - Wrist Extension with Resistance  - 1 x daily - 7 x weekly - 2-3 sets - 10-12 reps  ASSESSMENT:  CLINICAL IMPRESSION: Continued PT POC focused on chronic R medial epicondylitis. PT regressed fro lifting exercises due to pain response in lumbar region. Good tolerance to increase in resistance with wrist exercises. Good demonstration of lifting weighted dowel however cued patient to refrain from total elbow extension due to pain. PT encouraged activity modification with household chores. She still presenting with inflammation and pain in the medial elbow limiting her full participation with prolonged household cleaning and iADLs. She will continue to benefit from skilled physical therapy focused on graded strengthening, pain modulation, and activity modification in order to reduce further injury of R common extensor/flexor tendon.    OBJECTIVE IMPAIRMENTS: decreased strength, increased edema, increased muscle spasms, and pain.   ACTIVITY LIMITATIONS: carrying, lifting, sleeping, and reach over head  PARTICIPATION LIMITATIONS: meal prep, cleaning, and laundry  PERSONAL FACTORS: Age, Behavior pattern, and Time since onset of injury/illness/exacerbation are also affecting patient's functional outcome.   REHAB POTENTIAL: Good  CLINICAL DECISION MAKING: Stable/uncomplicated  EVALUATION COMPLEXITY: Low   GOALS: Goals reviewed with patient? Yes  SHORT TERM GOALS: Target date: 02/10/2024  Pt will be independent with HEP to improve strength and decrease shoulder pain to improve pain-free function at home and work. Baseline: 12/20/2023: Initial HEP provided Goal status: INITIAL   LONG TERM GOALS: Target date: 03/09/2024   1.  Pt will decrease worst elbow pain by at least 3 points on the NPRS in order to demonstrate clinically significant reduction in shoulder pain. Baseline: 12/20/2023: 9/10 NPS Goal status: INITIAL  2.  Pt will decrease quick DASH score  by at least 8% in order to demonstrate clinically significant reduction in disability related to shoulder pain        Baseline: 12/20/2023: 47.7 / 100 = 47.7 % Goal status: INITIAL  3. Pt will increase Wrist Flexion and extension strength by at least 1/2 MMT grade and without pain in order to demonstrate improvement in strength and function         Baseline: 12/20/2023:  Wrist R/L:  Flexion 4* 5  Extension 4* 5   Goal status: INITIAL  3. Pt will report ability to perform one swim bout session without pain in the elbow in order to demonstrate increase in wrist strength and decreased  pain.        Baseline: 12/20/2023: Goal status: INITIAL   3. Patient will demonstrate improved grip strength to >=80% of the uninvolved side to support functional hand use during ADLs and reduce strain on the medial elbow        Baseline: 12/20/2023: To be tested  Goal status: INITIAL   PLAN: PT FREQUENCY: 1-2x/week  PT DURATION: 8 weeks  PLANNED INTERVENTIONS: Therapeutic exercises, Therapeutic activity, Neuromuscular re-education, Balance training, Gait training, Patient/Family education, Self Care, Joint mobilization, Joint manipulation, Vestibular training, Canalith repositioning, Orthotic/Fit training, DME instructions, Dry Needling, Electrical stimulation, Spinal manipulation, Spinal mobilization, Cryotherapy, Moist heat, Taping, Traction, Ultrasound, Ionotophoresis 4mg /ml Dexamethasone, Manual therapy, and Re-evaluation.  PLAN FOR NEXT SESSION: Review HEP; progress Strengthening for elbow, forearm, shoulder    Lonni Pall PT, DPT Physical Therapist- [place] 01/13/2024, 11:17 AM

## 2024-01-17 ENCOUNTER — Ambulatory Visit

## 2024-01-17 DIAGNOSIS — M25521 Pain in right elbow: Secondary | ICD-10-CM | POA: Diagnosis not present

## 2024-01-17 DIAGNOSIS — M6281 Muscle weakness (generalized): Secondary | ICD-10-CM

## 2024-01-17 NOTE — Therapy (Signed)
 OUTPATIENT PHYSICAL THERAPY SHOULDER/ELBOW TREATMENT  Patient Name: Renee Gomez MRN: [id number] DOB:1970-09-02, 53 y.o., female Today's Date: 01/17/2024  END OF SESSION:  PT End of Session - 01/17/24 0948     Visit Number 9    Number of Visits 17    Date for PT Re-Evaluation 02/14/24    Authorization Type Eval 12/20/23    PT Start Time 0948    PT Stop Time 1030    PT Time Calculation (min) 42 min    Activity Tolerance Patient tolerated treatment well    Behavior During Therapy [facility] for tasks assessed/performed           Past Medical History:  Diagnosis Date   History of kidney stones    Moderate episode of recurrent major depressive disorder (HCC) 03/10/2017   Past Surgical History:  Procedure Laterality Date   APPENDECTOMY     BREAST EXCISIONAL BIOPSY Bilateral 25+yrs ago   neg   BREAST SURGERY     COLONOSCOPY WITH PROPOFOL  N/A 04/24/2019   Procedure: COLONOSCOPY WITH PROPOFOL ;  Surgeon: Unk Corinn Skiff, MD;  Location: [facility] ENDOSCOPY;  Service: Gastroenterology;  Laterality: N/A;   LITHOTRIPSY  1995   MINOR BREAST BIOPSY Bilateral    neg x 2   TOTAL HIP ARTHROPLASTY Right 12/2012   Patient Active Problem List   Diagnosis Date Noted   Medial epicondylitis, left 12/14/2023   Medial epicondylitis, right 11/16/2023   Prediabetes 04/09/2023   Family history of cerebral aneurysm 04/09/2023   Plantar wart of right foot 10/03/2019   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   Vertigo, intermittent 03/15/2018   Low back pain with sciatica 01/18/2015   Fibrocystic breast 01/18/2015   Acid reflux 01/18/2015   Hyperlipidemia, mild 01/18/2015   Knee pain, right 01/18/2015    PCP: Justus Leita DEL, MD   REFERRING PROVIDER: Alvia Selinda PARAS, MD   REFERRING DIAG:  M77.01 (ICD-10-CM) - Medial epicondylitis, right  M77.02 (ICD-10-CM) - Medial epicondylitis, left    RATIONALE FOR EVALUATION AND TREATMENT: Rehabilitation  THERAPY DIAG: Pain in right  elbow  Muscle weakness (generalized)  ONSET DATE: Chronic Elbow Pain  FOLLOW-UP APPT SCHEDULED WITH REFERRING PROVIDER: Yes  SUBJECTIVE:                                                                                                                                                                                         SUBJECTIVE STATEMENT:    Patient reporting to OPPT with a chief concern of Chronic R elbow pain.   PERTINENT HISTORY:   [name] is a 53 y.o. female reporting to OPPT with R  elbow pain. She reports that the pain worsens with driving, house work, and moving her arm into an outstretched movement. She reports that pain is alleviated with sleeping and rest. She reports that housework has been difficult with gripping objects. She states that her physician has provided prescription medications (meloxicam ) in order to improve the pain.   Activity Level: She uses the exercise bike and swimming as form of exercise. She reports that swimming has minor pain in the elbow.   Dominant hand: right  Imaging:   CLINICAL DATA:  Chronic swelling   EXAM: RIGHT ELBOW - 2 VIEW   COMPARISON:  None Available.   FINDINGS: There is no evidence of fracture, dislocation, or joint effusion. There is no evidence of arthropathy or other focal bone abnormality. Soft tissues are unremarkable.   IMPRESSION: Negative.     Electronically Signed   By: Luke Bun M.D.   On: 11/15/2023 16:09  PAIN:  Pain Intensity: Present: 8/10, Best: 8/10, Worst: 9/10 Pain location: R Medial/Lateral Elbow Pain Quality: constant and sharp  Radiating: Yes in tho Numbness/Tingling: No Focal Weakness: No 24-hour pain behavior: Activity dependent  History of prior shoulder or neck/shoulder injury, pain, surgery, or therapy: Yes  Red flags (personal history of cancer, chills/fever, night sweats, nausea, vomiting, unrelenting pain, unexplained weight gain/loss): Negative  PRECAUTIONS:  Fall  WEIGHT BEARING RESTRICTIONS: No  FALLS: Has patient fallen in last 6 months? 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 )  Cognition WNL    Gross Musculoskeletal Assessment Tremor: None Bulk: Normal Tone: Increased muscle tension along R Wrist flexor group and R Wrist extensor group  Gait Deferred  Posture  AROM AROM (Normal range in degrees) AROM   Right Left  Shoulder    Flexion WNL WNL  Extension    Abduction WNL* WNL  External Rotation    Internal Rotation    Hands Behind Head WNL*  WNL*  Hands Behind Back WNL WNl      Elbow    Flexion WNL* WNL  Extension WNL* WNl  Pronation WNL*    Supination    (* = pain; Blank rows = not tested)  UE MMT: MMT (out of 5) Right Left   Cervical (isometric)  Flexion WNL  Extension WNL  Lateral Flexion WNL WNL  Rotation WNL WNL      Shoulder   Flexion 4 4  Extension    Abduction 4 4  External rotation 5 5  Internal rotation 5 5  Horizontal abduction    Horizontal adduction    Lower Trapezius    Rhomboids        Elbow  Flexion 5 5  Extension 4 4  Pronation 4* 5  Supination 4 5      Wrist  Flexion 4* 5  Extension 4* 5  Radial deviation    Ulnar deviation        MCP  Flexion    Extension    Abduction    Adduction    (* = pain; Blank rows = not tested)  Sensation Grossly  intact to light touch bilateral UE as determined by testing dermatomes C2-T2. Proprioception and hot/cold testing deferred on this date.  Reflexes R/L Biceps (L3/4): 2+/2+  Triceps (S1/2):  2+/2+  Brachioradialis: 2+/2  Palpation Location LEFT  RIGHT           Medial Epicondyle   2  Lateral Epicondyle   1  Wrist Flexor Group   2  Wrist Extensor Group   1  Bicep Muscle   0  Tricep Muscle   0  Flexor Retinaculum   1      (Blank rows = not tested) Graded on 0-4 scale (0 = no pain, 1 = pain, 2 = pain with wincing/grimacing/flinching, 3 = pain with withdrawal, 4 = unwilling to allow palpation), (Blank rows = not tested)   Passive Accessory Intervertebral Motion Deferred   Accessory Motions/Glides  Elbow: Humeroulnar Ulnar Glide: + for pain  Humeroulnar Radial Glide: + for pain and hypomobile throughout     Muscle Length Testing Deferred    SPECIAL TESTS:  Medial Elbow:  Medial Elbow Test (Elbow Extension with passive wrist supination/extension):  R: + For concordant pain and tightness  TODAY'S TREATMENT  DATE: 01/17/24  Subjective: Patient reports 6/10 NPS in the R medial elbow. Continued pain in the L elbow with flexion. No questions or concerns.   Therapeutic Exercise:  Resisted Supination/Pronation  R: 1 x 10 - 5# (Short Lever); 2 x 10 4# DB (Long lever)   Wrist Flexion with Resistance   R: 3 x 10, 5# DB - Elbow supported by bolster  Wrist Extension with Resistance - Elbow unsupported - neutral beside waist  R: 3 x 10, 5# DB - Elbow supported by bolster  Seated Lat Pull Down   1 x 10 - 15#   1 x 10 - 20#   1 x 10 - 25#   Therapeutic Activity (Focused on improving grip strength and UE strength in order to optimize cleaning, lifting and carrying tasks at home): UBE (Seat 7) - 5 min - 2.5 min Fwd, 2.5 min Retro - Level 10-8 for UE Warm Up, strength and muscular endurance; PT manually adjusted resistance throughout to patient's tolerance.   TRX Scapular Row for postural and grip strength 2 x 10   R Bent Over Row - Supported on blue bolster  2 x 10 - 5#   Standing Barbell Curl with 8# Weighted Dowel   2 x 10 - 3# AW attached in middle    Manual Therapy (8 min):  Moderate STM applied to R wrist flexor and wrist extensor musculature for pain modulation and decrease muscle tension. Myofascial release, trigger point and contract and relax techniques utilized. Pt endorsed improvements in pain following intervention.    R Wrist Flexion Stretch - Elbow Extended   30s/bout x 3 in order to improve pain and tissue extensibility  R Wrist Extensors Stretch - Elbow Extended   30s/bout x 3 in order to improve pain and tissue extensibility  - Patient's muscle tension in R forearm flexors with decreased muscle tension in this session; PT encouraged continued adherence to stretch protocol.   PATIENT EDUCATION:  Education details: HEP, Exercise   Person educated: Patient Education method: Explanation, Demonstration, and Handouts Education comprehension: verbalized understanding and returned demonstration  HOME EXERCISE PROGRAM:  Access Code: [email or web address] URL: [email or web address] Date: 12/23/2023 Prepared by: Lonni Pall  Exercises - Standing Wrist Flexor Stretch with Arm Bent  - 1 x daily - 7 x weekly - 3 sets -  30s hold - Standing Wrist Extension Stretch  - 1 x daily - 7 x weekly - 3 sets - 30s hold - Wrist Flexion with Resistance  - 1 x daily - 7 x weekly - 2-3 sets - 10-12 reps - Wrist Extension with Resistance  - 1 x daily - 7 x weekly - 2-3 sets - 10-12 reps - Forearm Supination with Resistance  - 1 x daily - 7 x weekly - 2-3 sets - 10-12 reps - Standing Shoulder Row with Anchored Resistance  - 1 x daily - 3-4 x weekly - 2-3 sets - 10-12 reps  Access Code: [email or web address] URL: [email or web address] Date: 12/20/2023 Prepared by: Lonni Pall  Exercises - Standing Wrist Flexor Stretch with Arm Bent  - 1 x daily - 7 x weekly - 3 sets - 30s hold - Standing Wrist Extension Stretch  - 1 x daily - 7 x weekly - 3 sets - 30s hold - Wrist Flexion with Resistance  - 1 x daily - 7 x weekly - 2-3 sets -  10-12 reps - Wrist Extension with Resistance  - 1 x daily - 7 x weekly - 2-3 sets - 10-12 reps  ASSESSMENT:  CLINICAL IMPRESSION: Continued PT POC focused on chronic R medial epicondylitis. PT increased intensity with elbow/forearm interventions in today's session. She tolerated increased weight against elbow flexion and supination/pronation in the long lever position. Her pain continues to improve as expected with PT interventions however her L arm is progressively worsening. PT discussed possible adjustments to mops and broomsticks such as adding a grip in order to reduce tensile stress in medial epicondyles. She still presenting with inflammation and pain in the medial elbow limiting her full participation with prolonged household cleaning and iADLs. She will continue to benefit from skilled physical therapy focused on graded strengthening, pain modulation, and activity modification in order to reduce further injury of R common extensor/flexor tendon.    OBJECTIVE IMPAIRMENTS: decreased strength, increased edema, increased muscle spasms, and pain.   ACTIVITY LIMITATIONS: carrying, lifting, sleeping, and reach over head  PARTICIPATION LIMITATIONS: meal prep, cleaning, and laundry  PERSONAL FACTORS: Age, Behavior pattern, and Time since onset of injury/illness/exacerbation are also affecting patient's functional outcome.   REHAB POTENTIAL: Good  CLINICAL DECISION MAKING: Stable/uncomplicated  EVALUATION COMPLEXITY: Low   GOALS: Goals reviewed with patient? Yes  SHORT TERM GOALS: Target date: 02/14/2024  Pt will be independent with HEP to improve strength and decrease shoulder pain to improve pain-free function at home and work. Baseline: 12/20/2023: Initial HEP provided Goal status: INITIAL   LONG TERM GOALS: Target date: 03/13/2024   1.  Pt will decrease worst elbow pain by at least 3 points on the NPRS in order to demonstrate clinically significant reduction in shoulder  pain. Baseline: 12/20/2023: 9/10 NPS Goal status: INITIAL  2.  Pt will decrease quick DASH score by at least 8% in order to demonstrate clinically significant reduction in disability related to shoulder pain        Baseline: 12/20/2023: 47.7 / 100 = 47.7 % Goal status: INITIAL  3. Pt will increase Wrist Flexion and extension strength by at least 1/2 MMT grade and without pain in order to demonstrate improvement in strength and function         Baseline: 12/20/2023:  Wrist R/L:  Flexion 4* 5  Extension 4* 5   Goal status: INITIAL  3. Pt will report ability to perform one swim bout session without pain in the elbow in order to  demonstrate increase in wrist strength and decreased pain.        Baseline: 12/20/2023: Goal status: INITIAL   3. Patient will demonstrate improved grip strength to >=80% of the uninvolved side to support functional hand use during ADLs and reduce strain on the medial elbow        Baseline: 12/20/2023: To be tested  Goal status: INITIAL   PLAN: PT FREQUENCY: 1-2x/week  PT DURATION: 8 weeks  PLANNED INTERVENTIONS: Therapeutic exercises, Therapeutic activity, Neuromuscular re-education, Balance training, Gait training, Patient/Family education, Self Care, Joint mobilization, Joint manipulation, Vestibular training, Canalith repositioning, Orthotic/Fit training, DME instructions, Dry Needling, Electrical stimulation, Spinal manipulation, Spinal mobilization, Cryotherapy, Moist heat, Taping, Traction, Ultrasound, Ionotophoresis 4mg /ml Dexamethasone, Manual therapy, and Re-evaluation.  PLAN FOR NEXT SESSION: Review HEP; progress Strengthening for elbow, forearm, shoulder    Lonni Pall PT, DPT Physical Therapist- [place] 01/17/2024, 9:48 AM

## 2024-01-19 ENCOUNTER — Ambulatory Visit

## 2024-01-19 DIAGNOSIS — M25521 Pain in right elbow: Secondary | ICD-10-CM

## 2024-01-19 DIAGNOSIS — M6281 Muscle weakness (generalized): Secondary | ICD-10-CM

## 2024-01-19 DIAGNOSIS — M25531 Pain in right wrist: Secondary | ICD-10-CM

## 2024-01-19 NOTE — Therapy (Signed)
 OUTPATIENT PHYSICAL THERAPY SHOULDER/ELBOW TREATMENT/PROGRESS NOTE Dates of reporting period  12/20/2023   to   01/19/2024    Patient Name: Renee Gomez MRN: [id number] DOB:[birth year], 53 y.o., female Today's Date: 01/19/2024  END OF SESSION:  PT End of Session - 01/19/24 1559     Visit Number 10    Number of Visits 17    Date for PT Re-Evaluation 02/14/24    Authorization Type Eval 12/20/23    PT Start Time 1600    PT Stop Time 1645    PT Time Calculation (min) 45 min    Activity Tolerance Patient tolerated treatment well    Behavior During Therapy [facility] for tasks assessed/performed           Past Medical History:  Diagnosis Date   History of kidney stones    Moderate episode of recurrent major depressive disorder (HCC) 03/10/2017   Past Surgical History:  Procedure Laterality Date   APPENDECTOMY     BREAST EXCISIONAL BIOPSY Bilateral 25+yrs ago   neg   BREAST SURGERY     COLONOSCOPY WITH PROPOFOL  N/A 04/24/2019   Procedure: COLONOSCOPY WITH PROPOFOL ;  Surgeon: Unk Corinn Skiff, MD;  Location: [facility] ENDOSCOPY;  Service: Gastroenterology;  Laterality: N/A;   LITHOTRIPSY  1995   MINOR BREAST BIOPSY Bilateral    neg x 2   TOTAL HIP ARTHROPLASTY Right 12/2012   Patient Active Problem List   Diagnosis Date Noted   Medial epicondylitis, left 12/14/2023   Medial epicondylitis, right 11/16/2023   Prediabetes 04/09/2023   Family history of cerebral aneurysm 04/09/2023   Plantar wart of right foot 10/03/2019   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   Vertigo, intermittent 03/15/2018   Low back pain with sciatica 01/18/2015   Fibrocystic breast 01/18/2015   Acid reflux 01/18/2015   Hyperlipidemia, mild 01/18/2015   Knee pain, right 01/18/2015    PCP: Renee Leita DEL, MD   REFERRING PROVIDER: Alvia Selinda PARAS, MD   REFERRING DIAG:  M77.01 (ICD-10-CM) - Medial epicondylitis, right  M77.02 (ICD-10-CM) - Medial epicondylitis, left    RATIONALE  FOR EVALUATION AND TREATMENT: Rehabilitation  THERAPY DIAG: Pain in right elbow  Muscle weakness (generalized)  Pain in right wrist  ONSET DATE: Chronic Elbow Pain  FOLLOW-UP APPT SCHEDULED WITH REFERRING PROVIDER: Yes  SUBJECTIVE:                                                                                                                                                                                         SUBJECTIVE STATEMENT:    Patient reporting to OPPT with a chief concern of Chronic R elbow pain.  PERTINENT HISTORY:   [name] is a 53 y.o. female reporting to OPPT with R elbow pain. She reports that the pain worsens with driving, house work, and moving her arm into an outstretched movement. She reports that pain is alleviated with sleeping and rest. She reports that housework has been difficult with gripping objects. She states that her physician has provided prescription medications (meloxicam ) in order to improve the pain.   Activity Level: She uses the exercise bike and swimming as form of exercise. She reports that swimming has minor pain in the elbow.   Dominant hand: right  Imaging:   CLINICAL DATA:  Chronic swelling   EXAM: RIGHT ELBOW - 2 VIEW   COMPARISON:  None Available.   FINDINGS: There is no evidence of fracture, dislocation, or joint effusion. There is no evidence of arthropathy or other focal bone abnormality. Soft tissues are unremarkable.   IMPRESSION: Negative.     Electronically Signed   By: Renee Gomez M.D.   On: 11/15/2023 16:09  PAIN:  Pain Intensity: Present: 8/10, Best: 8/10, Worst: 9/10 Pain location: R Medial/Lateral Elbow Pain Quality: constant and sharp  Radiating: Yes in tho Numbness/Tingling: No Focal Weakness: No 24-hour pain behavior: Activity dependent  History of prior shoulder or neck/shoulder injury, pain, surgery, or therapy: Yes  Red flags (personal history of cancer, chills/fever, night sweats,  nausea, vomiting, unrelenting pain, unexplained weight gain/loss): Negative  PRECAUTIONS: Fall  WEIGHT BEARING RESTRICTIONS: No  FALLS: Has patient fallen in last 6 months? 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 )  Cognition WNL    Gross Musculoskeletal Assessment Tremor: None Bulk: Normal Tone: Increased muscle tension along R Wrist flexor group and R Wrist extensor group  Gait Deferred  Posture  AROM AROM (Normal range in degrees) AROM   Right Left  Shoulder    Flexion WNL WNL  Extension    Abduction WNL* WNL  External Rotation    Internal Rotation    Hands Behind Head WNL*  WNL*  Hands Behind Back WNL WNl      Elbow    Flexion WNL* WNL  Extension WNL* WNl  Pronation WNL*    Supination    (* = pain; Blank rows = not tested)  UE MMT: MMT (out of 5) Right Left   Cervical (isometric)  Flexion WNL  Extension WNL  Lateral Flexion WNL WNL  Rotation WNL WNL      Shoulder   Flexion 4 4  Extension    Abduction 4 4  External rotation 5 5  Internal rotation 5 5  Horizontal abduction    Horizontal adduction    Lower Trapezius    Rhomboids        Elbow  Flexion 5 5  Extension 4 4  Pronation 4* 5  Supination 4 5      Wrist  Flexion 4* 5  Extension 4* 5  Radial deviation    Ulnar deviation        MCP  Flexion    Extension    Abduction  Adduction    (* = pain; Blank rows = not tested)  Sensation Grossly intact to light touch bilateral UE as determined by testing dermatomes C2-T2. Proprioception and  hot/cold testing deferred on this date.  Reflexes R/L Biceps (L3/4): 2+/2+  Triceps (S1/2): 2+/2+  Brachioradialis: 2+/2  Palpation Location LEFT  RIGHT           Medial Epicondyle   2  Lateral Epicondyle   1  Wrist Flexor Group   2  Wrist Extensor Group   1  Bicep Muscle   0  Tricep Muscle   0  Flexor Retinaculum   1      (Blank rows = not tested) Graded on 0-4 scale (0 = no pain, 1 = pain, 2 = pain with wincing/grimacing/flinching, 3 = pain with withdrawal, 4 = unwilling to allow palpation), (Blank rows = not tested)   Passive Accessory Intervertebral Motion Deferred   Accessory Motions/Glides  Elbow: Humeroulnar Ulnar Glide: + for pain  Humeroulnar Radial Glide: + for pain and hypomobile throughout     Muscle Length Testing Deferred    SPECIAL TESTS:  Medial Elbow:  Medial Elbow Test (Elbow Extension with passive wrist supination/extension):  R: + For concordant pain and tightness  TODAY'S TREATMENT  DATE: 01/19/24  Subjective: Patient reports 5/10 NPS in the R medial elbow. Patient able to swim this morning with minimal pain following session. She hasn't bought wide grips or poly foam for her broomsticks/mops.  No questions or concerns.   Therapeutic Exercise:  Resisted Supination/Pronation - Alternating Dir  R: 1 x 10 - 5# (Short Lever); 1 x 10 5# (long lever)   Wrist Flexion with Resistance   R: 2 x 10, 6# DB, 1 x 10 5#DB - Elbow supported by bolster  Wrist Extension with Resistance - Elbow unsupported - neutral beside waist  R: 2 x 10 6# DB, 1 x 10 5# DB - Elbow supported by bolster  Seated Lat Pull Down   1 x 10 - 15#   1 x 10 - 20#   Seated Scapular Row  1 x 10 - 15#   1 x 10 - 20#   Therapeutic Activity (Focused on improving grip strength and UE strength in order to optimize cleaning, lifting and carrying tasks at home):  R Bent Over Row - Supported on blue bolster  1 x 10 5# DB 1 x 10 6# DB   Standing Barbell Curl with 8# Weighted  Dowel   1 x 10 - 8# Dowel   1 x 5 - 5# AW attached in the middle   1 x 10 - 8# Dowel   Body Blade for muscle endurance, rhythmic stabilization for shoulder and forearm muscles    RUE: 3 x 30s    PATIENT EDUCATION:  Education details: HEP, Exercise   Person educated: Patient Education method: Explanation, Demonstration, and Handouts Education comprehension: verbalized understanding and returned demonstration  HOME EXERCISE PROGRAM:  Access Code: [email or web address] URL: [email or web address] Date: 12/23/2023 Prepared by: Lonni Pall  Exercises - Standing Wrist Flexor Stretch with Arm Bent  - 1 x daily - 7 x weekly - 3 sets - 30s hold - Standing Wrist Extension Stretch  - 1 x daily - 7 x weekly - 3 sets - 30s hold - Wrist Flexion with Resistance  - 1 x daily - 7 x weekly - 2-3 sets - 10-12 reps - Wrist Extension with Resistance  - 1 x daily - 7 x weekly - 2-3 sets -  10-12 reps - Forearm Supination with Resistance  - 1 x daily - 7 x weekly - 2-3 sets - 10-12 reps - Standing Shoulder Row with Anchored Resistance  - 1 x daily - 3-4 x weekly - 2-3 sets - 10-12 reps  Access Code: [email or web address] URL: [email or web address] Date: 12/20/2023 Prepared by: Lonni Pall  Exercises - Standing Wrist Flexor Stretch with Arm Bent  - 1 x daily - 7 x weekly - 3 sets - 30s hold - Standing Wrist Extension Stretch  - 1 x daily - 7 x weekly - 3 sets - 30s hold - Wrist Flexion with Resistance  - 1 x daily - 7 x weekly - 2-3 sets - 10-12 reps - Wrist Extension with Resistance  - 1 x daily - 7 x weekly - 2-3 sets - 10-12 reps  ASSESSMENT:  CLINICAL IMPRESSION: Patient arrived to 10th visit warranting progress note. Patient has demonstrated increase in strength in R wrist flexor/extensors however she still has pain present with prolonged household ADLs. No notable discrepancies with grip strength between both UE. She reports minor pain following swimming session in her medial elbow in  both UE but it subsides after rest. Remainder of the session focused on progressive strengthening and UE endurance. Patient tolerated increased resistance with wrist exercises and demonstrated good ability to maintain shoulder flexion against prolonged external perturbations without pain. Overall the patient has demonstrated improvements in strength and pain in the L medial epicondyle. Current PT POC remains appropriate at this time and PT will continue progressive strengthening in order to reduce tensile stress at elbow joint. She will continue to benefit from skilled physical therapy focused on graded strengthening, pain modulation, and activity modification in order to reduce further injury of R common extensor/flexor tendon.   OBJECTIVE IMPAIRMENTS: decreased strength, increased edema, increased muscle spasms, and pain.   ACTIVITY LIMITATIONS: carrying, lifting, sleeping, and reach over head  PARTICIPATION LIMITATIONS: meal prep, cleaning, and laundry  PERSONAL FACTORS: Age, Behavior pattern, and Time since onset of injury/illness/exacerbation are also affecting patient's functional outcome.   REHAB POTENTIAL: Good  CLINICAL DECISION MAKING: Stable/uncomplicated  EVALUATION COMPLEXITY: Low   GOALS: Goals reviewed with patient? Yes  SHORT TERM GOALS: Target date: 02/16/2024  Pt will be independent with HEP to improve strength and decrease shoulder pain to improve pain-free function at home and work. Baseline: 12/20/2023: Initial HEP provided Goal status: INITIAL   LONG TERM GOALS: Target date: 03/15/2024   1.  Pt will decrease worst elbow pain by at least 3 points on the NPRS in order to demonstrate clinically significant reduction in shoulder pain. Baseline: 12/20/2023: 9/10 NPS; 01/19/2024: 6/10 NPS  Goal status: Progressing   2.  Pt will decrease quick DASH score by at least 8% in order to demonstrate clinically significant reduction in disability related to shoulder pain         Baseline: 12/20/2023: 47.7 / 100 = 47.7 % Goal status: INITIAL  3. Pt will increase Wrist Flexion and extension strength by at least 1/2 MMT grade and without pain in order to demonstrate improvement in strength and function         Baseline: 12/20/2023:  Wrist R/L: 01/19/2024 R/L   Flexion 4* 5 4+   Extension 4* 5 4+*    Goal status: Progressing   4. Pt will report ability to perform one swim bout session without pain in the elbow in order to demonstrate increase in wrist strength and decreased pain.  Baseline: 12/20/2023: n/a; 01/19/2024: Minor pain following swimming  Goal status: Progressing   5. Patient will demonstrate improved grip strength to >=80% of the uninvolved side to support functional hand use during ADLs and reduce strain on the medial elbow        Baseline: 12/20/2023: To be tested 01/19/2024: 45#/40# Goal status: Deferred    PLAN: PT FREQUENCY: 1-2x/week  PT DURATION: 8 weeks  PLANNED INTERVENTIONS: Therapeutic exercises, Therapeutic activity, Neuromuscular re-education, Balance training, Gait training, Patient/Family education, Self Care, Joint mobilization, Joint manipulation, Vestibular training, Canalith repositioning, Orthotic/Fit training, DME instructions, Dry Needling, Electrical stimulation, Spinal manipulation, Spinal mobilization, Cryotherapy, Moist heat, Taping, Traction, Ultrasound, Ionotophoresis 4mg /ml Dexamethasone, Manual therapy, and Re-evaluation.  PLAN FOR NEXT SESSION: Review HEP; progress Strengthening for elbow, forearm, shoulder    Lonni Pall PT, DPT Physical Therapist- [facility] 01/19/2024, 4:02 PM

## 2024-01-24 ENCOUNTER — Ambulatory Visit

## 2024-01-26 ENCOUNTER — Ambulatory Visit

## 2024-01-26 DIAGNOSIS — M25521 Pain in right elbow: Secondary | ICD-10-CM

## 2024-01-26 DIAGNOSIS — M6281 Muscle weakness (generalized): Secondary | ICD-10-CM

## 2024-01-26 DIAGNOSIS — M25531 Pain in right wrist: Secondary | ICD-10-CM

## 2024-01-26 NOTE — Therapy (Signed)
 OUTPATIENT PHYSICAL THERAPY SHOULDER/ELBOW TREATMENT    Patient Name: Renee Gomez MRN: [id number] DOB:1970-10-09, 53 y.o., female Today's Date: 01/26/2024  END OF SESSION:  PT End of Session - 01/26/24 1518     Visit Number 11    Number of Visits 17    Date for PT Re-Evaluation 02/14/24    Authorization Type Eval 12/20/23    PT Start Time 1517    PT Stop Time 1600    PT Time Calculation (min) 43 min    Activity Tolerance Patient tolerated treatment well    Behavior During Therapy [facility] for tasks assessed/performed           Past Medical History:  Diagnosis Date   History of kidney stones    Moderate episode of recurrent major depressive disorder (HCC) 03/10/2017   Past Surgical History:  Procedure Laterality Date   APPENDECTOMY     BREAST EXCISIONAL BIOPSY Bilateral 25+yrs ago   neg   BREAST SURGERY     COLONOSCOPY WITH PROPOFOL  N/A 04/24/2019   Procedure: COLONOSCOPY WITH PROPOFOL ;  Surgeon: Unk Corinn Skiff, MD;  Location: [facility] ENDOSCOPY;  Service: Gastroenterology;  Laterality: N/A;   LITHOTRIPSY  1995   MINOR BREAST BIOPSY Bilateral    neg x 2   TOTAL HIP ARTHROPLASTY Right 12/2012   Patient Active Problem List   Diagnosis Date Noted   Medial epicondylitis, left 12/14/2023   Medial epicondylitis, right 11/16/2023   Prediabetes 04/09/2023   Family history of cerebral aneurysm 04/09/2023   Plantar wart of right foot 10/03/2019   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   Vertigo, intermittent 03/15/2018   Low back pain with sciatica 01/18/2015   Fibrocystic breast 01/18/2015   Acid reflux 01/18/2015   Hyperlipidemia, mild 01/18/2015   Knee pain, right 01/18/2015    PCP: Justus Leita DEL, MD   REFERRING PROVIDER: Alvia Selinda PARAS, MD   REFERRING DIAG:  M77.01 (ICD-10-CM) - Medial epicondylitis, right  M77.02 (ICD-10-CM) - Medial epicondylitis, left    RATIONALE FOR EVALUATION AND TREATMENT: Rehabilitation  THERAPY DIAG: Pain in  right elbow  Muscle weakness (generalized)  Pain in right wrist  ONSET DATE: Chronic Elbow Pain  FOLLOW-UP APPT SCHEDULED WITH REFERRING PROVIDER: Yes  SUBJECTIVE:                                                                                                                                                                                         SUBJECTIVE STATEMENT:    Patient reporting to OPPT with a chief concern of Chronic R elbow pain.   PERTINENT HISTORY:   [name] is a 53  y.o. female reporting to OPPT with R elbow pain. She reports that the pain worsens with driving, house work, and moving her arm into an outstretched movement. She reports that pain is alleviated with sleeping and rest. She reports that housework has been difficult with gripping objects. She states that her physician has provided prescription medications (meloxicam ) in order to improve the pain.   Activity Level: She uses the exercise bike and swimming as form of exercise. She reports that swimming has minor pain in the elbow.   Dominant hand: right  Imaging:   CLINICAL DATA:  Chronic swelling   EXAM: RIGHT ELBOW - 2 VIEW   COMPARISON:  None Available.   FINDINGS: There is no evidence of fracture, dislocation, or joint effusion. There is no evidence of arthropathy or other focal bone abnormality. Soft tissues are unremarkable.   IMPRESSION: Negative.     Electronically Signed   By: Luke Bun M.D.   On: 11/15/2023 16:09  PAIN:  Pain Intensity: Present: 8/10, Best: 8/10, Worst: 9/10 Pain location: R Medial/Lateral Elbow Pain Quality: constant and sharp  Radiating: Yes in tho Numbness/Tingling: No Focal Weakness: No 24-hour pain behavior: Activity dependent  History of prior shoulder or neck/shoulder injury, pain, surgery, or therapy: Yes  Red flags (personal history of cancer, chills/fever, night sweats, nausea, vomiting, unrelenting pain, unexplained weight gain/loss):  Negative  PRECAUTIONS: Fall  WEIGHT BEARING RESTRICTIONS: No  FALLS: Has patient fallen in last 6 months? 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 )  Cognition WNL    Gross Musculoskeletal Assessment Tremor: None Bulk: Normal Tone: Increased muscle tension along R Wrist flexor group and R Wrist extensor group  Gait Deferred  Posture  AROM AROM (Normal range in degrees) AROM   Right Left  Shoulder    Flexion WNL WNL  Extension    Abduction WNL* WNL  External Rotation    Internal Rotation    Hands Behind Head WNL*  WNL*  Hands Behind Back WNL WNl      Elbow    Flexion WNL* WNL  Extension WNL* WNl  Pronation WNL*    Supination    (* = pain; Blank rows = not tested)  UE MMT: MMT (out of 5) Right Left   Cervical (isometric)  Flexion WNL  Extension WNL  Lateral Flexion WNL WNL  Rotation WNL WNL      Shoulder   Flexion 4 4  Extension    Abduction 4 4  External rotation 5 5  Internal rotation 5 5  Horizontal abduction    Horizontal adduction    Lower Trapezius    Rhomboids        Elbow  Flexion 5 5  Extension 4 4  Pronation 4* 5  Supination 4 5      Wrist  Flexion 4* 5  Extension 4* 5  Radial deviation    Ulnar deviation        MCP  Flexion    Extension    Abduction    Adduction    (* = pain; Blank  rows = not tested)  Sensation Grossly intact to light touch bilateral UE as determined by testing dermatomes C2-T2. Proprioception and hot/cold testing deferred on this date.  Reflexes R/L Biceps (L3/4):  2+/2+  Triceps (S1/2): 2+/2+  Brachioradialis: 2+/2  Palpation Location LEFT  RIGHT           Medial Epicondyle   2  Lateral Epicondyle   1  Wrist Flexor Group   2  Wrist Extensor Group   1  Bicep Muscle   0  Tricep Muscle   0  Flexor Retinaculum   1      (Blank rows = not tested) Graded on 0-4 scale (0 = no pain, 1 = pain, 2 = pain with wincing/grimacing/flinching, 3 = pain with withdrawal, 4 = unwilling to allow palpation), (Blank rows = not tested)   Passive Accessory Intervertebral Motion Deferred   Accessory Motions/Glides  Elbow: Humeroulnar Ulnar Glide: + for pain  Humeroulnar Radial Glide: + for pain and hypomobile throughout     Muscle Length Testing Deferred    SPECIAL TESTS:  Medial Elbow:  Medial Elbow Test (Elbow Extension with passive wrist supination/extension):  R: + For concordant pain and tightness  TODAY'S TREATMENT  DATE: 01/26/24  Subjective: Patient reports 5/10 pain in the R medial elbow. She reports continued pain in the L elbow.  No questions or concerns.   Therapeutic Exercise:  UBE - 5 min - 2.5 min Fwd, 2.5 min Retro - Level 10-15 for improving UE strength; PT manually adjusted resistance throughout to patient's tolerance. (Tolerated increase in intensity, however RPM decreased)   Resisted Supination/Pronation - Alternating Dir  RUE: 3 x 10 ea direction - Hammer in Long Lever position   Wrist Flexion with Resistance   R: 3 x 10, 6# DB   Wrist Extension with Resistance - Elbow supported by blue bolster  R: 3 x 10 6# DB     Resisted Ulnar/Radial Deviation - Elbow Supported by blue bolster  RUE: 2 x 10 - hammer in long lever     Standing Barbell Curl (Weighted Dowel)    2 x 10 - 8# Weighted Dowel from sled   Rhythmic Stabilization using Body Blade for UE endurance and grip strength   30s x bout / 3 bouts   Manual Therapy (10 min billed):  Light STM applied to wrist flexor musculature for pain modulation and decrease muscle  tension. Myofascial release, trigger point and contract and relax techniques utilized. Pt endorsed improvements in pain following intervention.      PATIENT EDUCATION:  Education details: HEP, Exercise   Person educated: Patient Education method: Explanation, Demonstration, and Handouts Education comprehension: verbalized understanding and returned demonstration  HOME EXERCISE PROGRAM:  Access Code: [email or web address] URL: [email or web address] Date: 12/23/2023 Prepared by: Lonni Pall  Exercises - Standing Wrist Flexor Stretch with Arm Bent  - 1 x daily - 7 x weekly - 3 sets - 30s hold - Standing Wrist Extension Stretch  - 1 x daily - 7 x weekly - 3 sets - 30s hold - Wrist Flexion with Resistance  - 1 x daily - 7 x weekly - 2-3 sets - 10-12 reps - Wrist Extension with Resistance  - 1 x daily - 7 x weekly - 2-3 sets - 10-12 reps - Forearm Supination with Resistance  - 1 x daily - 7 x weekly - 2-3 sets - 10-12 reps - Standing Shoulder Row with Anchored Resistance  - 1 x daily - 3-4 x weekly - 2-3  sets - 10-12 reps  Access Code: [email or web address] URL: [email or web address] Date: 12/20/2023 Prepared by: Lonni Pall  Exercises - Standing Wrist Flexor Stretch with Arm Bent  - 1 x daily - 7 x weekly - 3 sets - 30s hold - Standing Wrist Extension Stretch  - 1 x daily - 7 x weekly - 3 sets - 30s hold - Wrist Flexion with Resistance  - 1 x daily - 7 x weekly - 2-3 sets - 10-12 reps - Wrist Extension with Resistance  - 1 x daily - 7 x weekly - 2-3 sets - 10-12 reps  ASSESSMENT:  CLINICAL IMPRESSION: Patient   OBJECTIVE IMPAIRMENTS: decreased strength, increased edema, increased muscle spasms, and pain.   ACTIVITY LIMITATIONS: carrying, lifting, sleeping, and reach over head  PARTICIPATION LIMITATIONS: meal prep, cleaning, and laundry  PERSONAL FACTORS: Age, Behavior pattern, and Time since onset of injury/illness/exacerbation are also affecting patient's functional  outcome.   REHAB POTENTIAL: Good  CLINICAL DECISION MAKING: Stable/uncomplicated  EVALUATION COMPLEXITY: Low   GOALS: Goals reviewed with patient? Yes  SHORT TERM GOALS: Target date: 02/23/2024  Pt will be independent with HEP to improve strength and decrease shoulder pain to improve pain-free function at home and work. Baseline: 12/20/2023: Initial HEP provided Goal status: INITIAL   LONG TERM GOALS: Target date: 03/22/2024   1.  Pt will decrease worst elbow pain by at least 3 points on the NPRS in order to demonstrate clinically significant reduction in shoulder pain. Baseline: 12/20/2023: 9/10 NPS; 01/19/2024: 6/10 NPS  Goal status: Progressing   2.  Pt will decrease quick DASH score by at least 8% in order to demonstrate clinically significant reduction in disability related to shoulder pain        Baseline: 12/20/2023: 47.7 / 100 = 47.7 % Goal status: INITIAL  3. Pt will increase Wrist Flexion and extension strength by at least 1/2 MMT grade and without pain in order to demonstrate improvement in strength and function         Baseline: 12/20/2023:  Wrist R/L: 01/19/2024 R/L   Flexion 4* 5 4+   Extension 4* 5 4+*    Goal status: Progressing   4. Pt will report ability to perform one swim bout session without pain in the elbow in order to demonstrate increase in wrist strength and decreased pain.        Baseline: 12/20/2023: n/a; 01/19/2024: Minor pain following swimming  Goal status: Progressing   5. Patient will demonstrate improved grip strength to >=80% of the uninvolved side to support functional hand use during ADLs and reduce strain on the medial elbow        Baseline: 12/20/2023: To be tested 01/19/2024: 45#/40# Goal status: Deferred    PLAN: PT FREQUENCY: 1-2x/week  PT DURATION: 8 weeks  PLANNED INTERVENTIONS: Therapeutic exercises, Therapeutic activity, Neuromuscular re-education, Balance training, Gait training, Patient/Family education, Self Care,  Joint mobilization, Joint manipulation, Vestibular training, Canalith repositioning, Orthotic/Fit training, DME instructions, Dry Needling, Electrical stimulation, Spinal manipulation, Spinal mobilization, Cryotherapy, Moist heat, Taping, Traction, Ultrasound, Ionotophoresis 4mg /ml Dexamethasone, Manual therapy, and Re-evaluation.  PLAN FOR NEXT SESSION: Review HEP; progress Strengthening for elbow, forearm, shoulder    Lonni Pall PT, DPT Physical Therapist- [place] 01/26/2024, 3:19 PM

## 2024-01-31 ENCOUNTER — Ambulatory Visit

## 2024-01-31 DIAGNOSIS — M25521 Pain in right elbow: Secondary | ICD-10-CM | POA: Diagnosis not present

## 2024-01-31 DIAGNOSIS — M25531 Pain in right wrist: Secondary | ICD-10-CM

## 2024-01-31 DIAGNOSIS — M6281 Muscle weakness (generalized): Secondary | ICD-10-CM

## 2024-01-31 NOTE — Therapy (Signed)
 OUTPATIENT PHYSICAL THERAPY SHOULDER/ELBOW TREATMENT    Patient Name: Renee Gomez MRN: [id number] DOB:May 09, 1971, 53 y.o., female Today's Date: 01/31/2024  END OF SESSION:  PT End of Session - 01/31/24 1602     Visit Number 12    Number of Visits 17    Date for PT Re-Evaluation 02/14/24    Authorization Type Eval 12/20/23    PT Start Time 1602    PT Stop Time 1645    PT Time Calculation (min) 43 min    Activity Tolerance Patient tolerated treatment well    Behavior During Therapy [facility] for tasks assessed/performed           Past Medical History:  Diagnosis Date   History of kidney stones    Moderate episode of recurrent major depressive disorder (HCC) 03/10/2017   Past Surgical History:  Procedure Laterality Date   APPENDECTOMY     BREAST EXCISIONAL BIOPSY Bilateral 25+yrs ago   neg   BREAST SURGERY     COLONOSCOPY WITH PROPOFOL  N/A 04/24/2019   Procedure: COLONOSCOPY WITH PROPOFOL ;  Surgeon: Unk Corinn Skiff, MD;  Location: [facility] END[facility]COPY;  Service: Gastroenterology;  Laterality: N/A;   LITHOTRIPSY  1995   MINOR BREAST BIOPSY Bilateral    neg x 2   TOTAL HIP ARTHROPLASTY Right 12/2012   Patient Active Problem List   Diagnosis Date Noted   Medial epicondylitis, left 12/14/2023   Medial epicondylitis, right 11/16/2023   Prediabetes 04/09/2023   Family history of cerebral aneurysm 04/09/2023   Plantar wart of right foot 10/03/2019   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   Vertigo, intermittent 03/15/2018   Low back pain with sciatica 01/18/2015   Fibrocystic breast 01/18/2015   Acid reflux 01/18/2015   Hyperlipidemia, mild 01/18/2015   Knee pain, right 01/18/2015    PCP: Justus Leita DEL, MD   REFERRING PROVIDER: Alvia Selinda PARAS, MD   REFERRING DIAG:  M77.01 (ICD-10-CM) - Medial epicondylitis, right  M77.02 (ICD-10-CM) - Medial epicondylitis, left    RATIONALE FOR EVALUATION AND TREATMENT: Rehabilitation  THERAPY DIAG: Pain in  right elbow  Muscle weakness (generalized)  Pain in right wrist  ONSET DATE: Chronic Elbow Pain  FOLLOW-UP APPT SCHEDULED WITH REFERRING PROVIDER: Yes  SUBJECTIVE:                                                                                                                                                                                         SUBJECTIVE STATEMENT:    Patient reporting to OPPT with a chief concern of Chronic R elbow pain.   PERTINENT HISTORY:   [name] is a 53  y.o. female reporting to OPPT with R elbow pain. She reports that the pain worsens with driving, house work, and moving her arm into an outstretched movement. She reports that pain is alleviated with sleeping and rest. She reports that housework has been difficult with gripping objects. She states that her physician has provided prescription medications (meloxicam ) in order to improve the pain.   Activity Level: She uses the exercise bike and swimming as form of exercise. She reports that swimming has minor pain in the elbow.   Dominant hand: right  Imaging:   CLINICAL DATA:  Chronic swelling   EXAM: RIGHT ELBOW - 2 VIEW   COMPARISON:  None Available.   FINDINGS: There is no evidence of fracture, dislocation, or joint effusion. There is no evidence of arthropathy or other focal bone abnormality. Soft tissues are unremarkable.   IMPRESSION: Negative.     Electronically Signed   By: Luke Bun M.D.   On: 11/15/2023 16:09  PAIN:  Pain Intensity: Present: 8/10, Best: 8/10, Worst: 9/10 Pain location: R Medial/Lateral Elbow Pain Quality: constant and sharp  Radiating: Yes in tho Numbness/Tingling: No Focal Weakness: No 24-hour pain behavior: Activity dependent  History of prior shoulder or neck/shoulder injury, pain, surgery, or therapy: Yes  Red flags (personal history of cancer, chills/fever, night sweats, nausea, vomiting, unrelenting pain, unexplained weight gain/loss):  Negative  PRECAUTIONS: Fall  WEIGHT BEARING RESTRICTIONS: No  FALLS: Has patient fallen in last 6 months? 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 )  Cognition WNL    Gross Musculoskeletal Assessment Tremor: None Bulk: Normal Tone: Increased muscle tension along R Wrist flexor group and R Wrist extensor group  Gait Deferred  Posture  AROM AROM (Normal range in degrees) AROM   Right Left  Shoulder    Flexion WNL WNL  Extension    Abduction WNL* WNL  External Rotation    Internal Rotation    Hands Behind Head WNL*  WNL*  Hands Behind Back WNL WNl      Elbow    Flexion WNL* WNL  Extension WNL* WNl  Pronation WNL*    Supination    (* = pain; Blank rows = not tested)  UE MMT: MMT (out of 5) Right Left   Cervical (isometric)  Flexion WNL  Extension WNL  Lateral Flexion WNL WNL  Rotation WNL WNL      Shoulder   Flexion 4 4  Extension    Abduction 4 4  External rotation 5 5  Internal rotation 5 5  Horizontal abduction    Horizontal adduction    Lower Trapezius    Rhomboids        Elbow  Flexion 5 5  Extension 4 4  Pronation 4* 5  Supination 4 5      Wrist  Flexion 4* 5  Extension 4* 5  Radial deviation    Ulnar deviation        MCP  Flexion    Extension    Abduction    Adduction    (* = pain; Blank  rows = not tested)  Sensation Grossly intact to light touch bilateral UE as determined by testing dermatomes C2-T2. Proprioception and hot/cold testing deferred on this date.  Reflexes R/L Biceps (L3/4):  2+/2+  Triceps (S1/2): 2+/2+  Brachioradialis: 2+/2  Palpation Location LEFT  RIGHT           Medial Epicondyle   2  Lateral Epicondyle   1  Wrist Flexor Group   2  Wrist Extensor Group   1  Bicep Muscle   0  Tricep Muscle   0  Flexor Retinaculum   1      (Blank rows = not tested) Graded on 0-4 scale (0 = no pain, 1 = pain, 2 = pain with wincing/grimacing/flinching, 3 = pain with withdrawal, 4 = unwilling to allow palpation), (Blank rows = not tested)   Passive Accessory Intervertebral Motion Deferred   Accessory Motions/Glides  Elbow: Humeroulnar Ulnar Glide: + for pain  Humeroulnar Radial Glide: + for pain and hypomobile throughout     Muscle Length Testing Deferred    SPECIAL TESTS:  Medial Elbow:  Medial Elbow Test (Elbow Extension with passive wrist supination/extension):  R: + For concordant pain and tightness  TODAY'S TREATMENT  DATE: 01/31/24  Subjective: Patient reports that she felt minor sharp pain in her R shoulder after the last PT appointment. She reports 5/10 in her R elbow; she reports that's her housework has lightened this past weekend.  No questions or concerns.   Therapeutic Exercise:  Cytogeneticist Row    3 x 12   Resisted Supination/Pronation - Alternating Dir  RUE: 3 x 10 ea direction - Hammer in Long Lever position   Wrist Flexion against Resistance - Elbow supported by blue bolster  R: 3 x 10 5# DB   Wrist Extension with Resistance - Elbow supported by blue bolster  R: 3 x 10 6# DB    Therapeutic Activity:  UBE - 6 min - 4 min Fwd,  2 min Retro - Level 16-10 for improving UE strength and endurance; PT manually adjusted resistance throughout to patient's tolerance. (Tolerated increase in intensity)  TRX Shoulder Row for elbow, shoulder strengthening in order to reduce forearm burden with carrying.   3 x 10   Shoulder Horizontal Abduction - Supported by [employer] - for parascapular, strengthening in  order for increased carry capacity   3 x 10 - 2# DB   PT education on proper strengthening of multiple joints in order to reduce burden of forearm muscles and optimize household ADLs.   Manual Therapy (10 min billed):  Light STM applied to wrist flexor musculature for pain modulation and decrease muscle tension. Myofascial release, trigger point and contract and relax techniques utilized. Pt endorsed improvements in pain following intervention.   Manual Wrist Flexion Stretch - Elbow Bent   R: 30s/bout x 2 in order to improve ROM and tissue extensibility   Manual Wrist Extension Stretch - Elbow Bent  R: 30s/bout x 2 in order to improve ROM and tissue extensibility    PATIENT EDUCATION:  Education details: HEP, Exercise Technique Person educated: Patient Education method: Explanation, Demonstration, and Handouts Education comprehension: verbalized understanding and returned demonstration  HOME EXERCISE PROGRAM:  Access Code: [email or web address] URL: [email or web address] Date: 12/23/2023 Prepared by: Lonni Pall  Exercises - Standing Wrist Flexor Stretch with Arm Bent  - 1 x daily - 7 x weekly - 3 sets - 30s hold - Standing Wrist Extension Stretch  - 1 x  daily - 7 x weekly - 3 sets - 30s hold - Wrist Flexion with Resistance  - 1 x daily - 7 x weekly - 2-3 sets - 10-12 reps - Wrist Extension with Resistance  - 1 x daily - 7 x weekly - 2-3 sets - 10-12 reps - Forearm Supination with Resistance  - 1 x daily - 7 x weekly - 2-3 sets - 10-12 reps - Standing Shoulder Row with Anchored Resistance  - 1 x daily - 3-4 x weekly - 2-3 sets - 10-12 reps  Access Code: [email or web address] URL: [email or web address] Date: 12/20/2023 Prepared by: Lonni Pall  Exercises - Standing Wrist Flexor Stretch with Arm Bent  - 1 x daily - 7 x weekly - 3 sets - 30s hold - Standing Wrist Extension Stretch  - 1 x daily - 7 x weekly - 3 sets - 30s hold - Wrist Flexion with Resistance  - 1 x  daily - 7 x weekly - 2-3 sets - 10-12 reps - Wrist Extension with Resistance  - 1 x daily - 7 x weekly - 2-3 sets - 10-12 reps  ASSESSMENT:  CLINICAL IMPRESSION: Continued  PT POC focused on UE strengthening in order to reduce further injury to medial epicondyle. She tolerated all exercises without exacerbation of pain in her R medial elbow. Therapeutic activities focused on global UE strengthening in order to offload forearm muscles with carrying, lifting and household ADLs. Her pain continues to improve with activity modification at home and PT interventions. No update to HEP today. Patient will benefit from skilled PT in order to facilitate return to PLOF and reduce further injury to R wrist common flexor tendon.   OBJECTIVE IMPAIRMENTS: decreased strength, increased edema, increased muscle spasms, and pain.   ACTIVITY LIMITATIONS: carrying, lifting, sleeping, and reach over head  PARTICIPATION LIMITATIONS: meal prep, cleaning, and laundry  PERSONAL FACTORS: Age, Behavior pattern, and Time since onset of injury/illness/exacerbation are also affecting patient's functional outcome.   REHAB POTENTIAL: Good  CLINICAL DECISION MAKING: Stable/uncomplicated  EVALUATION COMPLEXITY: Low   GOALS: Goals reviewed with patient? Yes  SHORT TERM GOALS: Target date: 02/28/2024  Pt will be independent with HEP to improve strength and decrease shoulder pain to improve pain-free function at home and work. Baseline: 12/20/2023: Initial HEP provided Goal status: INITIAL   LONG TERM GOALS: Target date: 03/27/2024   1.  Pt will decrease worst elbow pain by at least 3 points on the NPRS in order to demonstrate clinically significant reduction in shoulder pain. Baseline: 12/20/2023: 9/10 NPS; 01/19/2024: 6/10 NPS  Goal status: Progressing   2.  Pt will decrease quick DASH score by at least 8% in order to demonstrate clinically significant reduction in disability related to shoulder pain         Baseline: 12/20/2023: 47.7 / 100 = 47.7 % Goal status: INITIAL  3. Pt will increase Wrist Flexion and extension strength by at least 1/2 MMT grade and without pain in order to demonstrate improvement in strength and function         Baseline: 12/20/2023:  Wrist R/L: 01/19/2024 R/L   Flexion 4* 5 4+   Extension 4* 5 4+*    Goal status: Progressing   4. Pt will report ability to perform one swim bout session without pain in the elbow in order to demonstrate increase in wrist strength and decreased pain.        Baseline: 12/20/2023: n/a; 01/19/2024: Minor pain following swimming  Goal status: Progressing   5.  Patient will demonstrate improved grip strength to >=80% of the uninvolved side to support functional hand use during ADLs and reduce strain on the medial elbow        Baseline: 12/20/2023: To be tested 01/19/2024: 45#/40# Goal status: Deferred    PLAN: PT FREQUENCY: 1-2x/week  PT DURATION: 8 weeks  PLANNED INTERVENTIONS: Therapeutic exercises, Therapeutic activity, Neuromuscular re-education, Balance training, Gait training, Patient/Family education, Self Care, Joint mobilization, Joint manipulation, Vestibular training, Canalith repositioning, Orthotic/Fit training, DME instructions, Dry Needling, Electrical stimulation, Spinal manipulation, Spinal mobilization, Cryotherapy, Moist heat, Taping, Traction, Ultrasound, Ionotophoresis 4mg /ml Dexamethasone, Manual therapy, and Re-evaluation.  PLAN FOR NEXT SESSION: Review HEP; progress Strengthening for elbow, forearm, shoulder    Lonni Pall PT, DPT Physical Therapist- [place] 01/31/2024, 4:06 PM

## 2024-02-02 ENCOUNTER — Ambulatory Visit

## 2024-02-02 DIAGNOSIS — M25521 Pain in right elbow: Secondary | ICD-10-CM

## 2024-02-02 DIAGNOSIS — M6281 Muscle weakness (generalized): Secondary | ICD-10-CM

## 2024-02-02 DIAGNOSIS — M25531 Pain in right wrist: Secondary | ICD-10-CM

## 2024-02-02 NOTE — Therapy (Signed)
 OUTPATIENT PHYSICAL THERAPY SHOULDER/ELBOW TREATMENT    Patient Name: Renee Gomez MRN: [id number] DOB:12/05/1970, 53 y.o., female Today's Date: 02/02/2024  END OF SESSION:  PT End of Session - 02/02/24 1343     Visit Number 13    Number of Visits 17    Date for PT Re-Evaluation 02/14/24    Authorization Type Eval 12/20/23    Progress Note Due on Visit 20    PT Start Time 1343    PT Stop Time 1430    PT Time Calculation (min) 47 min    Activity Tolerance Patient tolerated treatment well    Behavior During Therapy [facility] for tasks assessed/performed           Past Medical History:  Diagnosis Date   History of kidney stones    Moderate episode of recurrent major depressive disorder (HCC) 03/10/2017   Past Surgical History:  Procedure Laterality Date   APPENDECTOMY     BREAST EXCISIONAL BIOPSY Bilateral 25+yrs ago   neg   BREAST SURGERY     COLONOSCOPY WITH PROPOFOL  N/A 04/24/2019   Procedure: COLONOSCOPY WITH PROPOFOL ;  Surgeon: Unk Corinn Skiff, MD;  Location: [facility] ENDOSCOPY;  Service: Gastroenterology;  Laterality: N/A;   LITHOTRIPSY  1995   MINOR BREAST BIOPSY Bilateral    neg x 2   TOTAL HIP ARTHROPLASTY Right 12/2012   Patient Active Problem List   Diagnosis Date Noted   Medial epicondylitis, left 12/14/2023   Medial epicondylitis, right 11/16/2023   Prediabetes 04/09/2023   Family history of cerebral aneurysm 04/09/2023   Plantar wart of right foot 10/03/2019   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   Vertigo, intermittent 03/15/2018   Low back pain with sciatica 01/18/2015   Fibrocystic breast 01/18/2015   Acid reflux 01/18/2015   Hyperlipidemia, mild 01/18/2015   Knee pain, right 01/18/2015    PCP: Justus Leita DEL, MD   REFERRING PROVIDER: Alvia Selinda PARAS, MD   REFERRING DIAG:  M77.01 (ICD-10-CM) - Medial epicondylitis, right  M77.02 (ICD-10-CM) - Medial epicondylitis, left    RATIONALE FOR EVALUATION AND TREATMENT:  Rehabilitation  THERAPY DIAG: Pain in right elbow  Muscle weakness (generalized)  Pain in right wrist  ONSET DATE: Chronic Elbow Pain  FOLLOW-UP APPT SCHEDULED WITH REFERRING PROVIDER: Yes  SUBJECTIVE:                                                                                                                                                                                         SUBJECTIVE STATEMENT:    Patient reporting to OPPT with a chief concern of Chronic R elbow pain.   PERTINENT HISTORY:  [name] is a 53 y.o. female reporting to OPPT with R elbow pain. She reports that the pain worsens with driving, house work, and moving her arm into an outstretched movement. She reports that pain is alleviated with sleeping and rest. She reports that housework has been difficult with gripping objects. She states that her physician has provided prescription medications (meloxicam ) in order to improve the pain.   Activity Level: She uses the exercise bike and swimming as form of exercise. She reports that swimming has minor pain in the elbow.   Dominant hand: right  Imaging:   CLINICAL DATA:  Chronic swelling   EXAM: RIGHT ELBOW - 2 VIEW   COMPARISON:  None Available.   FINDINGS: There is no evidence of fracture, dislocation, or joint effusion. There is no evidence of arthropathy or other focal bone abnormality. Soft tissues are unremarkable.   IMPRESSION: Negative.     Electronically Signed   By: Luke Bun M.D.   On: 11/15/2023 16:09  PAIN:  Pain Intensity: Present: 8/10, Best: 8/10, Worst: 9/10 Pain location: R Medial/Lateral Elbow Pain Quality: constant and sharp  Radiating: Yes in tho Numbness/Tingling: No Focal Weakness: No 24-hour pain behavior: Activity dependent  History of prior shoulder or neck/shoulder injury, pain, surgery, or therapy: Yes  Red flags (personal history of cancer, chills/fever, night sweats, nausea, vomiting, unrelenting  pain, unexplained weight gain/loss): Negative  PRECAUTIONS: Fall  WEIGHT BEARING RESTRICTIONS: No  FALLS: Has patient fallen in last 6 months? 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 )  Cognition WNL    Gross Musculoskeletal Assessment Tremor: None Bulk: Normal Tone: Increased muscle tension along R Wrist flexor group and R Wrist extensor group  Gait Deferred  Posture  AROM AROM (Normal range in degrees) AROM   Right Left  Shoulder    Flexion WNL WNL  Extension    Abduction WNL* WNL  External Rotation    Internal Rotation    Hands Behind Head WNL*  WNL*  Hands Behind Back WNL WNl      Elbow    Flexion WNL* WNL  Extension WNL* WNl  Pronation WNL*    Supination    (* = pain; Blank rows = not tested)  UE MMT: MMT (out of 5) Right Left   Cervical (isometric)  Flexion WNL  Extension WNL  Lateral Flexion WNL WNL  Rotation WNL WNL      Shoulder   Flexion 4 4  Extension    Abduction 4 4  External rotation 5 5  Internal rotation 5 5  Horizontal abduction    Horizontal adduction    Lower Trapezius    Rhomboids        Elbow  Flexion 5 5  Extension 4 4  Pronation 4* 5  Supination 4 5      Wrist  Flexion 4* 5  Extension 4* 5  Radial deviation    Ulnar deviation        MCP  Flexion    Extension    Abduction    Adduction    (* =  pain; Blank rows = not tested)  Sensation Grossly intact to light touch bilateral UE as determined by testing dermatomes C2-T2. Proprioception and hot/cold testing deferred on this  date.  Reflexes R/L Biceps (L3/4): 2+/2+  Triceps (S1/2): 2+/2+  Brachioradialis: 2+/2  Palpation Location LEFT  RIGHT           Medial Epicondyle   2  Lateral Epicondyle   1  Wrist Flexor Group   2  Wrist Extensor Group   1  Bicep Muscle   0  Tricep Muscle   0  Flexor Retinaculum   1      (Blank rows = not tested) Graded on 0-4 scale (0 = no pain, 1 = pain, 2 = pain with wincing/grimacing/flinching, 3 = pain with withdrawal, 4 = unwilling to allow palpation), (Blank rows = not tested)   Passive Accessory Intervertebral Motion Deferred   Accessory Motions/Glides  Elbow: Humeroulnar Ulnar Glide: + for pain  Humeroulnar Radial Glide: + for pain and hypomobile throughout     Muscle Length Testing Deferred    SPECIAL TESTS:  Medial Elbow:  Medial Elbow Test (Elbow Extension with passive wrist supination/extension):  R: + For concordant pain and tightness  TODAY'S TREATMENT  DATE: 02/02/24  Subjective: Patient reported no adverse effects following last PT session. She reports that her L elbow has been worsening. Currently she rates her pain at 5/10 in her R elbow.  No questions or concerns.   Therapeutic Exercise:  Cytogeneticist Row    1 x 10 - 15#   2 x 10 - 20#   Resisted Supination/Pronation - Alternating Dir  RUE: 3 x 10 ea direction - 5# DB   Wrist Flexion against Resistance for wrist flexors - Elbow supported by blue bolster  R: 1 x 10 - 5# DB; 2 x 10 - 6# DB   Wrist Extension with Resistance - Elbow supported by blue bolster  R: 3 x 10 6# DB    Time spent educating patient on updated HEP and HEP frequency due to longer weekend. Patient with verbal understanding of all exercises and set/rep frequency.    Therapeutic Activity:  UBE - 6 min - 2.5 min Fwd,  2.5 min Retro - Level 16-10 for improving UE strength and endurance; PT manually adjusted resistance throughout to patient's tolerance.   Standing Barbell Curl  3 x 10 - 8#  Weighted Dowel   Standing OH reaching with Medball to targets on wall to increase lifting/reaching capacity  2 x 10 - 3 Kg Medball   1 x 15 - 2 Kg Medball  Suitcase Carry to increase capacity of carrying and gripping light to medium weights  4 x 12' - 10# KB    Shoulder Horizontal Abduction - Supported by Landy Maryelizabeth Furnace - for parascapular, strengthening in order for increased carry capacity   3 x 10 - 2# DB   Manual Therapy (10 min billed):  Light STM applied to wrist flexor musculature for pain modulation and decrease muscle tension. Myofascial release, trigger point and contract and relax techniques utilized. Pt endorsed improvements in pain following intervention.   Manual Wrist Flexion Stretch - Elbow Bent   R: 30s/bout x 2 in order to improve ROM and tissue extensibility   Manual Wrist Extension Stretch - Elbow Bent  R: 30s/bout x 2 in order to improve ROM and tissue extensibility    PATIENT EDUCATION:  Education details: HEP, Exercise Technique Person educated: Patient Education method: Explanation,  Demonstration, and Handouts Education comprehension: verbalized understanding and returned demonstration  HOME EXERCISE PROGRAM:  Access Code: [email or web address] URL: [email or web address] Date: 02/02/2024 Prepared by: Lonni Pall  Exercises - Standing Wrist Flexor Stretch with Arm Bent  - 1 x daily - 7 x weekly - 3 sets - 30s hold - Standing Wrist Extension Stretch  - 1 x daily - 7 x weekly - 3 sets - 30s hold - Wrist Flexion with Resistance  - 1 x daily - 7 x weekly - 2-3 sets - 10-12 reps - Wrist Extension with Resistance  - 1 x daily - 7 x weekly - 2-3 sets - 10-12 reps - Forearm Supination with Resistance  - 1 x daily - 7 x weekly - 2-3 sets - 10-12 reps - Standing Shoulder Row with Anchored Resistance  - 1 x daily - 3-4 x weekly - 2-3 sets - 10-12 reps - Forearm Pronation and Supination with Hammer  - 1 x daily - 3-4 x weekly - 2-3 sets - 10 reps  Access  Code: [email or web address] URL: [email or web address] Date: 12/20/2023 Prepared by: Lonni Pall  Exercises - Standing Wrist Flexor Stretch with Arm Bent  - 1 x daily - 7 x weekly - 3 sets - 30s hold - Standing Wrist Extension Stretch  - 1 x daily - 7 x weekly - 3 sets - 30s hold - Wrist Flexion with Resistance  - 1 x daily - 7 x weekly - 2-3 sets - 10-12 reps - Wrist Extension with Resistance  - 1 x daily - 7 x weekly - 2-3 sets - 10-12 reps  ASSESSMENT:  CLINICAL IMPRESSION: Continued  PT POC focused on UE strengthening in order to reduce further injury to medial epicondyle. She tolerated all exercises without exacerbation of pain in her R medial elbow. Pt endorsed improvements in medial elbow tension and pain following manual techniques. PT will continue progressively loading R elbow in order to decrease tensile stress at medial epicondyle. Although R elbow presents with improvements her L elbow (common flexor tendon) has worsened. PT encouraged adherence to HEP in order to decrease risk of further injury in L elbow. Therapeutic activities focused on global UE strengthening in order to offload forearm muscles with carrying, lifting and household ADLs. Her pain continues to improve with activity modification at home and PT interventions. No update to HEP today. Patient will benefit from skilled PT in order to facilitate return to PLOF and reduce further injury to R wrist common flexor tendon.   OBJECTIVE IMPAIRMENTS: decreased strength, increased edema, increased muscle spasms, and pain.   ACTIVITY LIMITATIONS: carrying, lifting, sleeping, and reach over head  PARTICIPATION LIMITATIONS: meal prep, cleaning, and laundry  PERSONAL FACTORS: Age, Behavior pattern, and Time since onset of injury/illness/exacerbation are also affecting patient's functional outcome.   REHAB POTENTIAL: Good  CLINICAL DECISION MAKING: Stable/uncomplicated  EVALUATION COMPLEXITY: Low   GOALS: Goals  reviewed with patient? Yes  SHORT TERM GOALS: Target date: 03/01/2024  Pt will be independent with HEP to improve strength and decrease shoulder pain to improve pain-free function at home and work. Baseline: 12/20/2023: Initial HEP provided Goal status: INITIAL   LONG TERM GOALS: Target date: 03/29/2024   1.  Pt will decrease worst elbow pain by at least 3 points on the NPRS in order to demonstrate clinically significant reduction in shoulder pain. Baseline: 12/20/2023: 9/10 NPS; 01/19/2024: 6/10 NPS  Goal status: Progressing   2.  Pt will decrease quick DASH score by at least 8% in order to  demonstrate clinically significant reduction in disability related to shoulder pain        Baseline: 12/20/2023: 47.7 / 100 = 47.7 % Goal status: INITIAL  3. Pt will increase Wrist Flexion and extension strength by at least 1/2 MMT grade and without pain in order to demonstrate improvement in strength and function         Baseline: 12/20/2023:  Wrist R/L: 01/19/2024 R/L   Flexion 4* 5 4+   Extension 4* 5 4+*    Goal status: Progressing   4. Pt will report ability to perform one swim bout session without pain in the elbow in order to demonstrate increase in wrist strength and decreased pain.        Baseline: 12/20/2023: n/a; 01/19/2024: Minor pain following swimming  Goal status: Progressing   5. Patient will demonstrate improved grip strength to >=80% of the uninvolved side to support functional hand use during ADLs and reduce strain on the medial elbow        Baseline: 12/20/2023: To be tested 01/19/2024: 45#/40# Goal status: Deferred    PLAN: PT FREQUENCY: 1-2x/week  PT DURATION: 8 weeks  PLANNED INTERVENTIONS: Therapeutic exercises, Therapeutic activity, Neuromuscular re-education, Balance training, Gait training, Patient/Family education, Self Care, Joint mobilization, Joint manipulation, Vestibular training, Canalith repositioning, Orthotic/Fit training, DME instructions, Dry  Needling, Electrical stimulation, Spinal manipulation, Spinal mobilization, Cryotherapy, Moist heat, Taping, Traction, Ultrasound, Ionotophoresis 4mg /ml Dexamethasone, Manual therapy, and Re-evaluation.  PLAN FOR NEXT SESSION: Review HEP; progress Strengthening for elbow, forearm, shoulder    Lonni Pall PT, DPT Physical Therapist- [place] 02/02/2024, 1:44 PM

## 2024-02-09 ENCOUNTER — Ambulatory Visit: Attending: Family Medicine

## 2024-02-09 DIAGNOSIS — M25531 Pain in right wrist: Secondary | ICD-10-CM | POA: Diagnosis present

## 2024-02-09 DIAGNOSIS — M25521 Pain in right elbow: Secondary | ICD-10-CM | POA: Insufficient documentation

## 2024-02-09 DIAGNOSIS — M6281 Muscle weakness (generalized): Secondary | ICD-10-CM | POA: Diagnosis present

## 2024-02-09 NOTE — Therapy (Signed)
 OUTPATIENT PHYSICAL THERAPY SHOULDER/ELBOW TREATMENT    Patient Name: Renee Gomez MRN: [id number] DOB:19-Aug-1970, 53 y.o., female Today's Date: 02/09/2024  END OF SESSION:  PT End of Session - 02/09/24 1349     Visit Number 14    Number of Visits 17    Date for PT Re-Evaluation 02/14/24    Authorization Type Eval 12/20/23    Progress Note Due on Visit 20    PT Start Time 1348    PT Stop Time 1430    PT Time Calculation (min) 42 min    Activity Tolerance Patient tolerated treatment well    Behavior During Therapy [facility] for tasks assessed/performed           Past Medical History:  Diagnosis Date   History of kidney stones    Moderate episode of recurrent major depressive disorder (HCC) 03/10/2017   Past Surgical History:  Procedure Laterality Date   APPENDECTOMY     BREAST EXCISIONAL BIOPSY Bilateral 25+yrs ago   neg   BREAST SURGERY     COLONOSCOPY WITH PROPOFOL  N/A 04/24/2019   Procedure: COLONOSCOPY WITH PROPOFOL ;  Surgeon: Unk Corinn Skiff, MD;  Location: [facility] ENDOSCOPY;  Service: Gastroenterology;  Laterality: N/A;   LITHOTRIPSY  1995   MINOR BREAST BIOPSY Bilateral    neg x 2   TOTAL HIP ARTHROPLASTY Right 12/2012   Patient Active Problem List   Diagnosis Date Noted   Medial epicondylitis, left 12/14/2023   Medial epicondylitis, right 11/16/2023   Prediabetes 04/09/2023   Family history of cerebral aneurysm 04/09/2023   Plantar wart of right foot 10/03/2019   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   Vertigo, intermittent 03/15/2018   Low back pain with sciatica 01/18/2015   Fibrocystic breast 01/18/2015   Acid reflux 01/18/2015   Hyperlipidemia, mild 01/18/2015   Knee pain, right 01/18/2015    PCP: Justus Leita DEL, MD   REFERRING PROVIDER: Alvia Selinda PARAS, MD   REFERRING DIAG:  M77.01 (ICD-10-CM) - Medial epicondylitis, right  M77.02 (ICD-10-CM) - Medial epicondylitis, left    RATIONALE FOR EVALUATION AND TREATMENT:  Rehabilitation  THERAPY DIAG: Pain in right elbow  Muscle weakness (generalized)  Pain in right wrist  ONSET DATE: Chronic Elbow Pain  FOLLOW-UP APPT SCHEDULED WITH REFERRING PROVIDER: Yes  SUBJECTIVE:                                                                                                                                                                                         SUBJECTIVE STATEMENT:    Patient reporting to OPPT with a chief concern of Chronic R elbow pain.   PERTINENT HISTORY:  Renee Gomez is a 53 y.o. female reporting to OPPT with R elbow pain. She reports that the pain worsens with driving, house work, and moving her arm into an outstretched movement. She reports that pain is alleviated with sleeping and rest. She reports that housework has been difficult with gripping objects. She states that her physician has provided prescription medications (meloxicam ) in order to improve the pain.   Activity Level: She uses the exercise bike and swimming as form of exercise. She reports that swimming has minor pain in the elbow.   Dominant hand: right  Imaging:   CLINICAL DATA:  Chronic swelling   EXAM: RIGHT ELBOW - 2 VIEW   COMPARISON:  None Available.   FINDINGS: There is no evidence of fracture, dislocation, or joint effusion. There is no evidence of arthropathy or other focal bone abnormality. Soft tissues are unremarkable.   IMPRESSION: Negative.     Electronically Signed   By: Luke Bun M.D.   On: 11/15/2023 16:09  PAIN:  Pain Intensity: Present: 8/10, Best: 8/10, Worst: 9/10 Pain location: R Medial/Lateral Elbow Pain Quality: constant and sharp  Radiating: Yes in tho Numbness/Tingling: No Focal Weakness: No 24-hour pain behavior: Activity dependent  History of prior shoulder or neck/shoulder injury, pain, surgery, or therapy: Yes  Red flags (personal history of cancer, chills/fever, night sweats, nausea, vomiting, unrelenting  pain, unexplained weight gain/loss): Negative  PRECAUTIONS: Fall  WEIGHT BEARING RESTRICTIONS: No  FALLS: Has patient fallen in last 6 months? 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 )  Cognition WNL    Gross Musculoskeletal Assessment Tremor: None Bulk: Normal Tone: Increased muscle tension along R Wrist flexor group and R Wrist extensor group  Gait Deferred  Posture  AROM AROM (Normal range in degrees) AROM   Right Left  Shoulder    Flexion WNL WNL  Extension    Abduction WNL* WNL  External Rotation    Internal Rotation    Hands Behind Head WNL*  WNL*  Hands Behind Back WNL WNl      Elbow    Flexion WNL* WNL  Extension WNL* WNl  Pronation WNL*    Supination    (* = pain; Blank rows = not tested)  UE MMT: MMT (out of 5) Right Left   Cervical (isometric)  Flexion WNL  Extension WNL  Lateral Flexion WNL WNL  Rotation WNL WNL      Shoulder   Flexion 4 4  Extension    Abduction 4 4  External rotation 5 5  Internal rotation 5 5  Horizontal abduction    Horizontal adduction    Lower Trapezius    Rhomboids        Elbow  Flexion 5 5  Extension 4 4  Pronation 4* 5  Supination 4 5      Wrist  Flexion 4* 5  Extension 4* 5  Radial deviation    Ulnar deviation        MCP  Flexion    Extension    Abduction    Adduction    (* =  pain; Blank rows = not tested)  Sensation Grossly intact to light touch bilateral UE as determined by testing dermatomes C2-T2. Proprioception and hot/cold testing deferred on this  date.  Reflexes R/L Biceps (L3/4): 2+/2+  Triceps (S1/2): 2+/2+  Brachioradialis: 2+/2  Palpation Location LEFT  RIGHT           Medial Epicondyle   2  Lateral Epicondyle   1  Wrist Flexor Group   2  Wrist Extensor Group   1  Bicep Muscle   0  Tricep Muscle   0  Flexor Retinaculum   1      (Blank rows = not tested) Graded on 0-4 scale (0 = no pain, 1 = pain, 2 = pain with wincing/grimacing/flinching, 3 = pain with withdrawal, 4 = unwilling to allow palpation), (Blank rows = not tested)   Passive Accessory Intervertebral Motion Deferred   Accessory Motions/Glides  Elbow: Humeroulnar Ulnar Glide: + for pain  Humeroulnar Radial Glide: + for pain and hypomobile throughout     Muscle Length Testing Deferred    SPECIAL TESTS:  Medial Elbow:  Medial Elbow Test (Elbow Extension with passive wrist supination/extension):  R: + For concordant pain and tightness  TODAY'S TREATMENT  DATE: 02/09/24  Subjective: Patient reports minimal improvement since the last PT session. Still painful with elbow extension. She reports that her L elbow is still progressively getting worse. She will follow up with Dr. Alvia on Monday. She rates her pain at 5-6/10 NPS. No further questions or concerned.  Therapeutic Exercise:  Resisted Supination/Pronation - Alternating Dir  RUE: 2 x 10 ea direction - 5# DB   Wrist Flexion against Resistance for wrist flexors - Elbow supported by blue bolster  R: 3 x 10 - 5# DB   Wrist Extension with Resistance - Elbow supported by blue bolster  R: 3 x 10 6# DB    Standing Mid Shoulder Row against resistance   2 x 10 - Blue TB   PT reviewed proper stretching techniques with pt recall on wrist flexor/extensor stretch with bent elbow. Good return demonstration with wrist flexor stretch; required tactile cues for wrist extensor group  Therapeutic Activity:  Standing Body Blade  RUE: 20s/bout x 3 bouts - For UE endurance or stability   Standing  Barbell Curl for carrying capacity   2 x 10 - 8# Weighted Dowel   Upward Reach with DB for lifting and reaching capacity at home   2 x 10 - 8# to different targets on wall   Manual Therapy (10 min billed):  Light STM applied to wrist flexor musculature for pain modulation and decrease muscle tension. Myofascial release, trigger point and contract and relax techniques utilized. Pt endorsed improvements in pain following intervention.   Manual Wrist Flexion Stretch - Elbow Bent   R: 30s/bout x 2 in order to improve ROM and tissue extensibility  Manual Wrist Extension Stretch - Elbow Bent  R: 30s/bout x 2 in order to improve ROM and tissue extensibility    PATIENT EDUCATION:  Education details: HEP, Exercise Technique Person educated: Patient Education method: Explanation, Demonstration, and Handouts Education comprehension: verbalized understanding and returned demonstration  HOME EXERCISE PROGRAM:  Access Code: [email or web address] URL: [email or web address] Date: 02/02/2024 Prepared by: Lonni Pall  Exercises - Standing Wrist Flexor Stretch with Arm Bent  - 1 x daily - 7 x weekly - 3 sets - 30s hold - Standing Wrist Extension Stretch  - 1 x daily - 7 x weekly - 3 sets -  30s hold - Wrist Flexion with Resistance  - 1 x daily - 7 x weekly - 2-3 sets - 10-12 reps - Wrist Extension with Resistance  - 1 x daily - 7 x weekly - 2-3 sets - 10-12 reps - Forearm Supination with Resistance  - 1 x daily - 7 x weekly - 2-3 sets - 10-12 reps - Standing Shoulder Row with Anchored Resistance  - 1 x daily - 3-4 x weekly - 2-3 sets - 10-12 reps - Forearm Pronation and Supination with Hammer  - 1 x daily - 3-4 x weekly - 2-3 sets - 10 reps  Access Code: [email or web address] URL: [email or web address] Date: 12/20/2023 Prepared by: Lonni Pall  Exercises - Standing Wrist Flexor Stretch with Arm Bent  - 1 x daily - 7 x weekly - 3 sets - 30s hold - Standing Wrist Extension Stretch  - 1  x daily - 7 x weekly - 3 sets - 30s hold - Wrist Flexion with Resistance  - 1 x daily - 7 x weekly - 2-3 sets - 10-12 reps - Wrist Extension with Resistance  - 1 x daily - 7 x weekly - 2-3 sets - 10-12 reps  ASSESSMENT:  CLINICAL IMPRESSION: Continued PT POC forcused on R wrist strengthening and stretching in order to reduce injury in medial epicondyle. Patient's pain has remained the same following progress note without improvement. She will f/u with physician regarding attaining steroid shot for the medial elbow in order to reduce pain.   Continued  PT POC focused on UE strengthening in order to reduce further injury to medial epicondyle. She tolerated all exercises without exacerbation of pain in her R medial elbow. Pt endorsed improvements in medial elbow tension and pain following manual techniques. PT will continue progressively loading R elbow in order to decrease tensile stress at medial epicondyle. Although R elbow presents with improvements her L elbow (common flexor tendon) has worsened. PT encouraged adherence to HEP in order to decrease risk of further injury in L elbow. Therapeutic activities focused on global UE strengthening in order to offload forearm muscles with carrying, lifting and household ADLs. Her pain continues to improve with activity modification at home and PT interventions. No update to HEP today. Patient will benefit from skilled PT in order to facilitate return to PLOF and reduce further injury to R wrist common flexor tendon.   OBJECTIVE IMPAIRMENTS: decreased strength, increased edema, increased muscle spasms, and pain.   ACTIVITY LIMITATIONS: carrying, lifting, sleeping, and reach over head  PARTICIPATION LIMITATIONS: meal prep, cleaning, and laundry  PERSONAL FACTORS: Age, Behavior pattern, and Time since onset of injury/illness/exacerbation are also affecting patient's functional outcome.   REHAB POTENTIAL: Good  CLINICAL DECISION MAKING:  Stable/uncomplicated  EVALUATION COMPLEXITY: Low   GOALS: Goals reviewed with patient? Yes  SHORT TERM GOALS: Target date: 03/08/2024  Pt will be independent with HEP to improve strength and decrease shoulder pain to improve pain-free function at home and work. Baseline: 12/20/2023: Initial HEP provided Goal status: INITIAL   LONG TERM GOALS: Target date: 04/05/2024   1.  Pt will decrease worst elbow pain by at least 3 points on the NPRS in order to demonstrate clinically significant reduction in shoulder pain. Baseline: 12/20/2023: 9/10 NPS; 01/19/2024: 6/10 NPS  Goal status: Progressing   2.  Pt will decrease quick DASH score by at least 8% in order to demonstrate clinically significant reduction in disability related to shoulder pain        Baseline: 12/20/2023: 47.7 /  100 = 47.7 % Goal status: INITIAL  3. Pt will increase Wrist Flexion and extension strength by at least 1/2 MMT grade and without pain in order to demonstrate improvement in strength and function         Baseline: 12/20/2023:  Wrist R/L: 01/19/2024 R/L   Flexion 4* 5 4+   Extension 4* 5 4+*    Goal status: Progressing   4. Pt will report ability to perform one swim bout session without pain in the elbow in order to demonstrate increase in wrist strength and decreased pain.        Baseline: 12/20/2023: n/a; 01/19/2024: Minor pain following swimming  Goal status: Progressing   5. Patient will demonstrate improved grip strength to >=80% of the uninvolved side to support functional hand use during ADLs and reduce strain on the medial elbow        Baseline: 12/20/2023: To be tested 01/19/2024: 45#/40# Goal status: Deferred    PLAN: PT FREQUENCY: 1-2x/week  PT DURATION: 8 weeks  PLANNED INTERVENTIONS: Therapeutic exercises, Therapeutic activity, Neuromuscular re-education, Balance training, Gait training, Patient/Family education, Self Care, Joint mobilization, Joint manipulation, Vestibular training,  Canalith repositioning, Orthotic/Fit training, DME instructions, Dry Needling, Electrical stimulation, Spinal manipulation, Spinal mobilization, Cryotherapy, Moist heat, Taping, Traction, Ultrasound, Ionotophoresis 4mg /ml Dexamethasone, Manual therapy, and Re-evaluation.  PLAN FOR NEXT SESSION: Review HEP; progress Strengthening for elbow, forearm, shoulder    Lonni Pall PT, DPT Physical Therapist-  02/09/2024, 1:50 PM

## 2024-02-14 ENCOUNTER — Other Ambulatory Visit (INDEPENDENT_AMBULATORY_CARE_PROVIDER_SITE_OTHER): Payer: Self-pay | Admitting: Radiology

## 2024-02-14 ENCOUNTER — Ambulatory Visit: Admitting: Family Medicine

## 2024-02-14 ENCOUNTER — Encounter: Payer: Self-pay | Admitting: Family Medicine

## 2024-02-14 VITALS — BP 120/74 | HR 82 | Ht 61.0 in | Wt 124.0 lb

## 2024-02-14 DIAGNOSIS — M7701 Medial epicondylitis, right elbow: Secondary | ICD-10-CM

## 2024-02-14 DIAGNOSIS — M7702 Medial epicondylitis, left elbow: Secondary | ICD-10-CM

## 2024-02-14 MED ORDER — TRIAMCINOLONE ACETONIDE 40 MG/ML IJ SUSP
80.0000 mg | Freq: Once | INTRAMUSCULAR | Status: AC
  Administered 2024-02-14: 80 mg via INTRA_ARTICULAR

## 2024-02-14 NOTE — Progress Notes (Signed)
 Primary Care / Sports Medicine Office Visit  Patient Information:  Patient ID: Renee Gomez, female DOB: [birth year] Age: 53 y.o. MRN: [id number]   Renee Gomez is a pleasant 53 y.o. female presenting with the following:  Chief Complaint  Patient presents with   Elbow Pain    Patient presents today for a follow up on her right elbow. She has been attending physical therapy and has seen a little improvement in her elbow. She would like to discuss getting a cortisone injection.    Vitals:   02/14/24 1310  BP: 120/74  Pulse: 82  SpO2: 99%   Vitals:   02/14/24 1310  Weight: 124 lb (56.2 kg)  Height: 5' 1 (1.549 m)   Body mass index is 23.43 kg/m.  No results found.   Discussed the use of AI scribe software for clinical note transcription with the patient, who gave verbal consent to proceed.   Independent interpretation of notes and tests performed by another provider:   None  Procedures performed:   Procedure:  Injection of right elbow under ultrasound guidance. Ultrasound guidance utilized for out of plane approach to the medial epicondyle, no sonographic abnormalities noted Samsung HS60 device utilized with permanent recording / reporting. Verbal informed consent obtained and verified. Skin prepped in a sterile fashion. Ethyl chloride for topical local analgesia.  Completed without difficulty and tolerated well. Medication: triamcinolone  acetonide 40 mg/mL suspension for injection 1 mL total and 2 mL lidocaine  1% without epinephrine utilized for needle placement anesthetic Advised to contact for fevers/chills, erythema, induration, drainage, or persistent bleeding.  Procedure:  Injection of left elbow under ultrasound guidance. Ultrasound guidance utilized for medial epicondyle injection, sonographically benign Samsung HS60 device utilized with permanent recording / reporting. Verbal informed consent obtained and verified. Skin prepped in a sterile  fashion. Ethyl chloride for topical local analgesia.  Completed without difficulty and tolerated well. Medication: triamcinolone  acetonide 40 mg/mL suspension for injection 1 mL total and 2 mL lidocaine  1% without epinephrine utilized for needle placement anesthetic Advised to contact for fevers/chills, erythema, induration, drainage, or persistent bleeding.   Pertinent History, Exam, Impression, and Recommendations:   Problem List Items Addressed This Visit     Medial epicondylitis, left   See additional assessment(s) for plan details.      Relevant Medications   triamcinolone  acetonide (KENALOG -40) injection 80 mg (Start on 02/14/2024  2:00 PM)   Other Relevant Orders   US  LIMITED JOINT SPACE STRUCTURES UP BILAT   Ambulatory referral to Physical Therapy   Medial epicondylitis, right - Primary   History of Present Illness Renee Gomez is a 53 year old female who presents with chronic elbow pain.  Chronic elbow pain - Elbow pain present for over three years - Pain is chronic and persists despite physical therapy - Physical therapy has provided some improvement but not complete relief - Pain is currently more pronounced on the left side; initially, the right side was more symptomatic - Concern about pain returning after three months if no further therapy is pursued  Pain management - Previously managed with meloxicam , but has run out of medication - Uses Advil as needed for pain relief - Upcoming physical therapy appointment scheduled - Considering massage therapy for additional pain management  Physical Exam  RIGHT ELBOW INSPECTION: No swelling, erythema, or deformity PALPATION: Tenderness over the medial epicondyle RANGE OF MOTION: Full and symmetric, no restriction STRENGTH: 5/5 overall strength, pain reproduced with resisted wrist flexion and forearm  pronation SPECIAL TESTS: Pain with resisted wrist flexion in full elbow extension; pain with resisted forearm  pronation; symptoms lessen with elbow flexion. Findings consistent with medial epicondylitis (golfer's elbow)  LEFT ELBOW INSPECTION: No swelling, erythema, or deformity PALPATION: Tenderness over the medial epicondyle RANGE OF MOTION: Full and symmetric, no restriction STRENGTH: 5/5 overall strength, pain reproduced with resisted wrist flexion and forearm pronation SPECIAL TESTS: Pain with resisted wrist flexion in full elbow extension; pain with resisted pronation, improves with elbow flexion. Findings consistent with medial epicondylitis  Assessment and Plan Bilateral medial epicondylitis (right greater than left) Chronic tendinosis with significant right-sided pain despite therapy and medication. - Administer cortisone injection to both elbows under ultrasound guidance. - Prescribe meloxicam  to take daily until cortisone takes effect. Take with food. - Advise Advil or ibuprofen as needed. - Recommend icing 20 minutes twice daily today and then as-needed. - Advise relative rest for two days post-injection, avoiding heavy lifting. - Modify physical therapy to include both elbows.      Relevant Medications   triamcinolone  acetonide (KENALOG -40) injection 80 mg (Start on 02/14/2024  2:00 PM)   Other Relevant Orders   US  LIMITED JOINT SPACE STRUCTURES UP BILAT   Ambulatory referral to Physical Therapy     Orders & Medications Medications:  Meds ordered this encounter  Medications   triamcinolone  acetonide (KENALOG -40) injection 80 mg   Orders Placed This Encounter  Procedures   US  LIMITED JOINT SPACE STRUCTURES UP BILAT   Ambulatory referral to Physical Therapy     No follow-ups on file.     Renee JINNY Ku, MD, [facility]   Primary Care Sports Medicine Primary Care and Sports Medicine at [facility]

## 2024-02-14 NOTE — Assessment & Plan Note (Signed)
 History of Present Illness Renee Gomez is a 53 year old female who presents with chronic elbow pain.  Chronic elbow pain - Elbow pain present for over three years - Pain is chronic and persists despite physical therapy - Physical therapy has provided some improvement but not complete relief - Pain is currently more pronounced on the left side; initially, the right side was more symptomatic - Concern about pain returning after three months if no further therapy is pursued  Pain management - Previously managed with meloxicam , but has run out of medication - Uses Advil as needed for pain relief - Upcoming physical therapy appointment scheduled - Considering massage therapy for additional pain management  Physical Exam  RIGHT ELBOW INSPECTION: No swelling, erythema, or deformity PALPATION: Tenderness over the medial epicondyle RANGE OF MOTION: Full and symmetric, no restriction STRENGTH: 5/5 overall strength, pain reproduced with resisted wrist flexion and forearm pronation SPECIAL TESTS: Pain with resisted wrist flexion in full elbow extension; pain with resisted forearm pronation; symptoms lessen with elbow flexion. Findings consistent with medial epicondylitis (golfer's elbow)  LEFT ELBOW INSPECTION: No swelling, erythema, or deformity PALPATION: Tenderness over the medial epicondyle RANGE OF MOTION: Full and symmetric, no restriction STRENGTH: 5/5 overall strength, pain reproduced with resisted wrist flexion and forearm pronation SPECIAL TESTS: Pain with resisted wrist flexion in full elbow extension; pain with resisted pronation, improves with elbow flexion. Findings consistent with medial epicondylitis  Assessment and Plan Bilateral medial epicondylitis (right greater than left) Chronic tendinosis with significant right-sided pain despite therapy and medication. - Administer cortisone injection to both elbows under ultrasound guidance. - Prescribe meloxicam  to take daily  until cortisone takes effect. Take with food. - Advise Advil or ibuprofen as needed. - Recommend icing 20 minutes twice daily today and then as-needed. - Advise relative rest for two days post-injection, avoiding heavy lifting. - Modify physical therapy to include both elbows.

## 2024-02-14 NOTE — Patient Instructions (Signed)
 You have just been given a cortisone injection to reduce pain and inflammation. After the injection you may notice immediate relief of pain as a result of the Lidocaine . It is important to rest the area of the injection for 24 to 48 hours after the injection. There is a possibility of some temporary increased discomfort and swelling for up to 72 hours until the cortisone begins to work. If you do have pain, simply rest the joint and use ice. If you can tolerate over the counter medications, you can try Tylenol, Aleve , or Advil for added relief per package instructions.    Bilateral Medial Epicondylitis (Golfer's Elbow) Care  - Cortisone injections will be given to both elbows under ultrasound guidance. - Take meloxicam  daily with food until the cortisone injection takes effect. - Use Advil or ibuprofen as needed for pain. - Apply ice to each elbow for 20 minutes twice today, then as needed. - Rest both arms for two days after the injection; avoid heavy lifting or strenuous activity. - Continue and modify physical therapy to include both elbows. - Consider massage therapy if helpful.  When to Seek Immediate Medical Attention  - Sudden severe pain, swelling, or redness in either elbow - Signs of infection at the injection site (increased warmth, redness, pus, or fever) - Numbness, tingling, or loss of movement in the hand or arm  Continue to monitor your symptoms and follow up as directed.

## 2024-02-14 NOTE — Assessment & Plan Note (Signed)
 See additional assessment(s) for plan details.

## 2024-02-16 ENCOUNTER — Encounter

## 2024-02-22 NOTE — Therapy (Signed)
 OUTPATIENT PHYSICAL THERAPY SHOULDER/ELBOW TREATMENT    Patient Name: Renee Gomez MRN: [id number] DOB:22-Dec-1970, 53 y.o., female Today's Date: 02/22/2024  END OF SESSION:     Past Medical History:  Diagnosis Date   History of kidney stones    Moderate episode of recurrent major depressive disorder (HCC) 03/10/2017   Past Surgical History:  Procedure Laterality Date   APPENDECTOMY     BREAST EXCISIONAL BIOPSY Bilateral 25+yrs ago   neg   BREAST SURGERY     COLONOSCOPY WITH PROPOFOL  N/A 04/24/2019   Procedure: COLONOSCOPY WITH PROPOFOL ;  Surgeon: Unk Corinn Skiff, MD;  Location: [facility] ENDOSCOPY;  Service: Gastroenterology;  Laterality: N/A;   LITHOTRIPSY  1995   MINOR BREAST BIOPSY Bilateral    neg x 2   TOTAL HIP ARTHROPLASTY Right 12/2012   Patient Active Problem List   Diagnosis Date Noted   Medial epicondylitis, left 12/14/2023   Medial epicondylitis, right 11/16/2023   Prediabetes 04/09/2023   Family history of cerebral aneurysm 04/09/2023   Plantar wart of right foot 10/03/2019   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   Vertigo, intermittent 03/15/2018   Low back pain with sciatica 01/18/2015   Fibrocystic breast 01/18/2015   Acid reflux 01/18/2015   Hyperlipidemia, mild 01/18/2015   Knee pain, right 01/18/2015    PCP: Justus Leita DEL, MD   REFERRING PROVIDER: Alvia Selinda PARAS, MD   REFERRING DIAG:  M77.01 (ICD-10-CM) - Medial epicondylitis, right  M77.02 (ICD-10-CM) - Medial epicondylitis, left    RATIONALE FOR EVALUATION AND TREATMENT: Rehabilitation  THERAPY DIAG: Pain in right elbow  Muscle weakness (generalized)  Pain in right wrist  ONSET DATE: Chronic Elbow Pain  FOLLOW-UP APPT SCHEDULED WITH REFERRING PROVIDER: Yes  SUBJECTIVE:                                                                                                                                                                                          SUBJECTIVE STATEMENT:    Patient reporting to OPPT with a chief concern of Chronic R elbow pain.   PERTINENT HISTORY:   Renee Gomez is a 53 y.o. female reporting to OPPT with R elbow pain. She reports that the pain worsens with driving, house work, and moving her arm into an outstretched movement. She reports that pain is alleviated with sleeping and rest. She reports that housework has been difficult with gripping objects. She states that her physician has provided prescription medications (meloxicam ) in order to improve the pain.   Activity Level: She uses the exercise bike and swimming as form of exercise. She reports that swimming has minor pain  in the elbow.   Dominant hand: right  Imaging:   CLINICAL DATA:  Chronic swelling   EXAM: RIGHT ELBOW - 2 VIEW   COMPARISON:  None Available.   FINDINGS: There is no evidence of fracture, dislocation, or joint effusion. There is no evidence of arthropathy or other focal bone abnormality. Soft tissues are unremarkable.   IMPRESSION: Negative.     Electronically Signed   By: Luke Bun M.D.   On: 11/15/2023 16:09  PAIN:  Pain Intensity: Present: 8/10, Best: 8/10, Worst: 9/10 Pain location: R Medial/Lateral Elbow Pain Quality: constant and sharp  Radiating: Yes in tho Numbness/Tingling: No Focal Weakness: No 24-hour pain behavior: Activity dependent  History of prior shoulder or neck/shoulder injury, pain, surgery, or therapy: Yes  Red flags (personal history of cancer, chills/fever, night sweats, nausea, vomiting, unrelenting pain, unexplained weight gain/loss): Negative  PRECAUTIONS: Fall  WEIGHT BEARING RESTRICTIONS: No  FALLS: Has patient fallen in last 6 months? 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 )  Cognition WNL    Gross Musculoskeletal Assessment Tremor: None Bulk: Normal Tone: Increased muscle tension along R Wrist flexor group and R Wrist extensor group  Gait Deferred  Posture  AROM AROM (Normal range in degrees) AROM   Right Left  Shoulder    Flexion WNL WNL  Extension    Abduction WNL* WNL  External Rotation    Internal Rotation    Hands Behind Head WNL*  WNL*  Hands Behind Back WNL WNl      Elbow    Flexion WNL* WNL  Extension WNL* WNl  Pronation WNL*    Supination    (* = pain; Blank rows = not tested)  UE MMT: MMT (out of 5) Right Left   Cervical (isometric)  Flexion WNL  Extension WNL  Lateral Flexion WNL WNL  Rotation WNL WNL      Shoulder   Flexion 4 4  Extension    Abduction 4 4  External rotation 5 5  Internal rotation 5 5  Horizontal abduction    Horizontal adduction    Lower Trapezius    Rhomboids        Elbow  Flexion 5 5  Extension 4 4  Pronation 4* 5  Supination 4 5      Wrist  Flexion 4* 5  Extension 4* 5  Radial deviation    Ulnar deviation        MCP  Flexion    Extension    Abduction    Adduction    (* = pain; Blank rows = not tested)  Sensation Grossly intact to light touch bilateral UE as determined by testing dermatomes C2-T2. Proprioception and hot/cold testing deferred on this date.  Reflexes R/L Biceps (L3/4): 2+/2+  Triceps (S1/2): 2+/2+  Brachioradialis: 2+/2  Palpation Location LEFT  RIGHT           Medial Epicondyle   2  Lateral Epicondyle   1  Wrist Flexor Group   2  Wrist Extensor Group   1  Bicep Muscle  0  Tricep Muscle   0  Flexor Retinaculum   1      (Blank rows = not tested) Graded on 0-4 scale (0 = no pain, 1 = pain, 2 = pain with  wincing/grimacing/flinching, 3 = pain with withdrawal, 4 = unwilling to allow palpation), (Blank rows = not tested)   Passive Accessory Intervertebral Motion Deferred   Accessory Motions/Glides  Elbow: Humeroulnar Ulnar Glide: + for pain  Humeroulnar Radial Glide: + for pain and hypomobile throughout     Muscle Length Testing Deferred    SPECIAL TESTS:  Medial Elbow:  Medial Elbow Test (Elbow Extension with passive wrist supination/extension):  R: + For concordant pain and tightness  TODAY'S TREATMENT  DATE: 02/22/24 ***  Subjective: Patient reports minimal improvement since the last PT session. Still painful with elbow extension. She reports that her L elbow is still progressively getting worse. She will follow up with Dr. Alvia on Monday. She rates her pain at 5-6/10 NPS. No further questions or concerned.  Therapeutic Exercise:  Resisted Supination/Pronation - Alternating Dir  RUE: 2 x 10 ea direction - 5# DB   Wrist Flexion against Resistance for wrist flexors - Elbow supported by blue bolster  R: 3 x 10 - 5# DB   Wrist Extension with Resistance - Elbow supported by blue bolster  R: 3 x 10 6# DB    Standing Mid Shoulder Row against resistance   2 x 10 - Blue TB   PT reviewed proper stretching techniques with pt recall on wrist flexor/extensor stretch with bent elbow. Good return demonstration with wrist flexor stretch; required tactile cues for wrist extensor group  Therapeutic Activity:  Standing Body Blade  RUE: 20s/bout x 3 bouts - For UE endurance or stability   Standing Barbell Curl for carrying capacity   2 x 10 - 8# Weighted Dowel   Upward Reach with DB for lifting and reaching capacity at home   2 x 10 - 8# to different targets on wall   Manual Therapy (10 min billed):  Light STM applied to wrist flexor musculature for pain modulation and decrease muscle tension. Myofascial release, trigger point and contract and relax techniques utilized. Pt  endorsed improvements in pain following intervention.   Manual Wrist Flexion Stretch - Elbow Bent   R: 30s/bout x 2 in order to improve ROM and tissue extensibility  Manual Wrist Extension Stretch - Elbow Bent  R: 30s/bout x 2 in order to improve ROM and tissue extensibility    PATIENT EDUCATION:  Education details: HEP, Exercise Technique Person educated: Patient Education method: Explanation, Demonstration, and Handouts Education comprehension: verbalized understanding and returned demonstration  HOME EXERCISE PROGRAM:  Access Code: [email or web address] URL: [email or web address] Date: 02/02/2024 Prepared by: Lonni Pall  Exercises - Standing Wrist Flexor Stretch with Arm Bent  - 1 x daily - 7 x weekly - 3 sets - 30s hold - Standing Wrist Extension Stretch  - 1 x daily - 7 x weekly - 3 sets - 30s hold - Wrist Flexion with Resistance  - 1 x daily - 7 x weekly - 2-3 sets - 10-12 reps - Wrist Extension with Resistance  - 1 x daily - 7 x weekly - 2-3 sets - 10-12 reps - Forearm Supination with Resistance  - 1 x daily - 7 x weekly - 2-3 sets - 10-12 reps - Standing Shoulder Row with Anchored Resistance  - 1 x daily - 3-4 x weekly - 2-3 sets - 10-12 reps - Forearm  Pronation and Supination with Hammer  - 1 x daily - 3-4 x weekly - 2-3 sets - 10 reps  Access Code: [email or web address] URL: [email or web address] Date: 12/20/2023 Prepared by: Lonni Pall  Exercises - Standing Wrist Flexor Stretch with Arm Bent  - 1 x daily - 7 x weekly - 3 sets - 30s hold - Standing Wrist Extension Stretch  - 1 x daily - 7 x weekly - 3 sets - 30s hold - Wrist Flexion with Resistance  - 1 x daily - 7 x weekly - 2-3 sets - 10-12 reps - Wrist Extension with Resistance  - 1 x daily - 7 x weekly - 2-3 sets - 10-12 reps  ASSESSMENT:  CLINICAL IMPRESSION: ***  Continued PT POC forcused on R wrist strengthening and stretching in order to reduce injury in medial epicondyle. Patient's pain has  remained the same following progress note without improvement. She will f/u with physician regarding attaining steroid shot for the medial elbow in order to reduce pain.   Continued  PT POC focused on UE strengthening in order to reduce further injury to medial epicondyle. She tolerated all exercises without exacerbation of pain in her R medial elbow. Pt endorsed improvements in medial elbow tension and pain following manual techniques. PT will continue progressively loading R elbow in order to decrease tensile stress at medial epicondyle. Although R elbow presents with improvements her L elbow (common flexor tendon) has worsened. PT encouraged adherence to HEP in order to decrease risk of further injury in L elbow. Therapeutic activities focused on global UE strengthening in order to offload forearm muscles with carrying, lifting and household ADLs. Her pain continues to improve with activity modification at home and PT interventions. No update to HEP today. Patient will benefit from skilled PT in order to facilitate return to PLOF and reduce further injury to R wrist common flexor tendon.   OBJECTIVE IMPAIRMENTS: decreased strength, increased edema, increased muscle spasms, and pain.   ACTIVITY LIMITATIONS: carrying, lifting, sleeping, and reach over head  PARTICIPATION LIMITATIONS: meal prep, cleaning, and laundry  PERSONAL FACTORS: Age, Behavior pattern, and Time since onset of injury/illness/exacerbation are also affecting patient's functional outcome.   REHAB POTENTIAL: Good  CLINICAL DECISION MAKING: Stable/uncomplicated  EVALUATION COMPLEXITY: Low   GOALS: Goals reviewed with patient? Yes  SHORT TERM GOALS: Target date: 03/21/2024  Pt will be independent with HEP to improve strength and decrease shoulder pain to improve pain-free function at home and work. Baseline: 12/20/2023: Initial HEP provided Goal status: INITIAL   LONG TERM GOALS: Target date: 04/18/2024   1.  Pt will  decrease worst elbow pain by at least 3 points on the NPRS in order to demonstrate clinically significant reduction in shoulder pain. Baseline: 12/20/2023: 9/10 NPS; 01/19/2024: 6/10 NPS  Goal status: Progressing   2.  Pt will decrease quick DASH score by at least 8% in order to demonstrate clinically significant reduction in disability related to shoulder pain        Baseline: 12/20/2023: 47.7 / 100 = 47.7 % Goal status: INITIAL  3. Pt will increase Wrist Flexion and extension strength by at least 1/2 MMT grade and without pain in order to demonstrate improvement in strength and function         Baseline: 12/20/2023:  Wrist R/L: 01/19/2024 R/L   Flexion 4* 5 4+   Extension 4* 5 4+*    Goal status: Progressing   4. Pt will report ability to perform one swim bout session without pain  in the elbow in order to demonstrate increase in wrist strength and decreased pain.        Baseline: 12/20/2023: n/a; 01/19/2024: Minor pain following swimming  Goal status: Progressing   5. Patient will demonstrate improved grip strength to >=80% of the uninvolved side to support functional hand use during ADLs and reduce strain on the medial elbow        Baseline: 12/20/2023: To be tested 01/19/2024: 45#/40# Goal status: Deferred    PLAN: PT FREQUENCY: 1-2x/week  PT DURATION: 8 weeks  PLANNED INTERVENTIONS: Therapeutic exercises, Therapeutic activity, Neuromuscular re-education, Balance training, Gait training, Patient/Family education, Self Care, Joint mobilization, Joint manipulation, Vestibular training, Canalith repositioning, Orthotic/Fit training, DME instructions, Dry Needling, Electrical stimulation, Spinal manipulation, Spinal mobilization, Cryotherapy, Moist heat, Taping, Traction, Ultrasound, Ionotophoresis 4mg /ml Dexamethasone, Manual therapy, and Re-evaluation.  PLAN FOR NEXT SESSION: Review HEP; progress Strengthening for elbow, forearm, shoulder    Maryanne Finder, PT, DPT Physical  Therapist - [facility] 02/22/2024, 12:52 PM

## 2024-02-23 ENCOUNTER — Ambulatory Visit

## 2024-02-23 DIAGNOSIS — M25521 Pain in right elbow: Secondary | ICD-10-CM | POA: Diagnosis not present

## 2024-02-23 DIAGNOSIS — M25531 Pain in right wrist: Secondary | ICD-10-CM

## 2024-02-23 DIAGNOSIS — M6281 Muscle weakness (generalized): Secondary | ICD-10-CM

## 2024-02-28 ENCOUNTER — Ambulatory Visit

## 2024-03-01 ENCOUNTER — Encounter

## 2024-03-07 ENCOUNTER — Encounter

## 2024-03-13 ENCOUNTER — Encounter

## 2024-03-16 ENCOUNTER — Encounter

## 2024-03-20 ENCOUNTER — Encounter

## 2024-03-21 ENCOUNTER — Other Ambulatory Visit: Payer: Self-pay | Admitting: Internal Medicine

## 2024-03-21 DIAGNOSIS — E785 Hyperlipidemia, unspecified: Secondary | ICD-10-CM

## 2024-03-22 ENCOUNTER — Encounter

## 2024-03-23 NOTE — Telephone Encounter (Signed)
 Requested Prescriptions  Pending Prescriptions Disp Refills   rosuvastatin  (CRESTOR ) 10 MG tablet [Pharmacy Med Name: ROSUVASTATIN  10MG  TABLETS] 90 tablet 1    Sig: TAKE 1 TABLET(10 MG) BY MOUTH DAILY     Cardiovascular:  Antilipid - Statins 2 Failed - 03/23/2024 12:39 PM      Failed - Lipid Panel in normal range within the last 12 months    Cholesterol, Total  Date Value Ref Range Status  10/07/2023 221 (H) 100 - 199 mg/dL Final   LDL Chol Calc (NIH)  Date Value Ref Range Status  10/07/2023 145 (H) 0 - 99 mg/dL Final   HDL  Date Value Ref Range Status  10/07/2023 60 >39 mg/dL Final   Triglycerides  Date Value Ref Range Status  10/07/2023 91 0 - 149 mg/dL Final         Passed - Cr in normal range and within 360 days    Creatinine, Ser  Date Value Ref Range Status  10/07/2023 0.63 0.57 - 1.00 mg/dL Final         Passed - Patient is not pregnant      Passed - Valid encounter within last 12 months    Recent Outpatient Visits           1 month ago Medial epicondylitis, right   [facility] Alvia, Selinda PARAS, MD   3 months ago Medial epicondylitis, right   [facility] at [facility] Alvia, Selinda PARAS, MD   4 months ago Medial epicondylitis, right   [facility] at [facility] Alvia, Selinda PARAS, MD   4 months ago Acute midline low back pain without sciatica   [facility] at [facility], Leita DEL, MD   4 months ago Eye pain, bilateral   [facility] at [facility], Leita DEL, MD       Future Appointments             In 2 weeks Justus, Leita DEL, MD [facility] at [facility], [phone] [place]

## 2024-03-27 ENCOUNTER — Encounter

## 2024-03-30 ENCOUNTER — Encounter

## 2024-04-03 ENCOUNTER — Encounter

## 2024-04-05 ENCOUNTER — Encounter

## 2024-04-10 ENCOUNTER — Other Ambulatory Visit: Payer: Self-pay | Admitting: Internal Medicine

## 2024-04-10 NOTE — Progress Notes (Unsigned)
 Date:  04/10/2024   Name:  Renee Gomez   DOB:  1971/01/01   MRN:  [id number]   Chief Complaint: No chief complaint on file.  HPI  Review of Systems   Lab Results  Component Value Date   NA 142 10/07/2023   K 4.2 10/07/2023   CO2 25 10/07/2023   GLUCOSE 88 10/07/2023   BUN 14 10/07/2023   CREATININE 0.63 10/07/2023   CALCIUM  9.4 10/07/2023   EGFR 105 10/07/2023   GFRNONAA 110 04/02/2020   Lab Results  Component Value Date   CHOL 221 (H) 10/07/2023   HDL 60 10/07/2023   LDLCALC 145 (H) 10/07/2023   TRIG 91 10/07/2023   CHOLHDL 3.7 10/07/2023   Lab Results  Component Value Date   TSH 2.220 04/09/2023   Lab Results  Component Value Date   HGBA1C 5.9 (H) 04/09/2023   Lab Results  Component Value Date   WBC 6.2 04/09/2023   HGB 13.6 04/09/2023   HCT 42.2 04/09/2023   MCV 95 04/09/2023   PLT 240 04/09/2023   Lab Results  Component Value Date   ALT 14 10/07/2023   AST 20 10/07/2023   ALKPHOS 68 10/07/2023   BILITOT 0.4 10/07/2023   No results found for: MARIEN BOLLS, VD25OH   Patient Active Problem List   Diagnosis Date Noted   Medial epicondylitis, left 12/14/2023   Medial epicondylitis, right 11/16/2023   Prediabetes 04/09/2023   Family history of cerebral aneurysm 04/09/2023   Plantar wart of right foot 10/03/2019   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   Vertigo, intermittent 03/15/2018   Low back pain with sciatica 01/18/2015   Fibrocystic breast 01/18/2015   Acid reflux 01/18/2015   Hyperlipidemia, mild 01/18/2015   Knee pain, right 01/18/2015    No Known Allergies  Past Surgical History:  Procedure Laterality Date   APPENDECTOMY     BREAST EXCISIONAL BIOPSY Bilateral 25+yrs ago   neg   BREAST SURGERY     COLONOSCOPY WITH PROPOFOL  N/A 04/24/2019   Procedure: COLONOSCOPY WITH PROPOFOL ;  Surgeon: Unk Corinn Skiff, MD;  Location: [facility] ENDOSCOPY;  Service: Gastroenterology;  Laterality: N/A;   LITHOTRIPSY   1995   MINOR BREAST BIOPSY Bilateral    neg x 2   TOTAL HIP ARTHROPLASTY Right 12/2012    Social History   Tobacco Use   Smoking status: Never    Passive exposure: Never   Smokeless tobacco: Never  Vaping Use   Vaping status: Never Used  Substance Use Topics   Alcohol use: No    Alcohol/week: 0.0 standard drinks of alcohol   Drug use: No     Medication list has been reviewed and updated.  No outpatient medications have been marked as taking for the 04/10/24 encounter (Orders Only) with Justus Leita DEL, MD.       11/10/2023   11:22 AM 10/07/2023   11:02 AM 04/09/2023   10:37 AM 12/30/2022    1:37 PM  GAD 7 : Generalized Anxiety Score  Nervous, Anxious, on Edge 1 1 1 1   Control/stop worrying 1 1 1 1   Worry too much - different things 1 1 1 1   Trouble relaxing 1 1 1 1   Restless 1 1 1 1   Easily annoyed or irritable 1 1 2 1   Afraid - awful might happen 1 1 2 1   Total GAD 7 Score 7 7 9 7   Anxiety Difficulty Somewhat difficult Somewhat difficult Very difficult Somewhat difficult  02/14/2024    1:14 PM 11/10/2023   11:22 AM 10/07/2023   11:02 AM  Depression screen PHQ 2/9  Decreased Interest 0 0 0  Down, Depressed, Hopeless 0 1 1  PHQ - 2 Score 0 1 1  Altered sleeping  2 2  Tired, decreased energy  2 2  Change in appetite  1 1  Feeling bad or failure about yourself   1 1  Trouble concentrating  1 1  Moving slowly or fidgety/restless  0 0  Suicidal thoughts  0 0  PHQ-9 Score  8 8  Difficult doing work/chores  Not difficult at all Somewhat difficult    BP Readings from Last 3 Encounters:  02/14/24 120/74  12/14/23 120/68  11/16/23 116/70    Physical Exam  Wt Readings from Last 3 Encounters:  02/14/24 124 lb (56.2 kg)  12/14/23 123 lb (55.8 kg)  11/16/23 123 lb (55.8 kg)    LMP 03/11/2020   Assessment and Plan:  Problem List Items Addressed This Visit   None   No follow-ups on file.    Leita HILARIO Adie, MD [facility] [place]

## 2024-04-11 ENCOUNTER — Ambulatory Visit: Admitting: Internal Medicine

## 2024-04-11 ENCOUNTER — Encounter: Payer: Self-pay | Admitting: Internal Medicine

## 2024-04-11 VITALS — BP 122/74 | HR 77 | Ht 61.0 in | Wt 124.0 lb

## 2024-04-11 DIAGNOSIS — R7303 Prediabetes: Secondary | ICD-10-CM | POA: Diagnosis not present

## 2024-04-11 DIAGNOSIS — Z1231 Encounter for screening mammogram for malignant neoplasm of breast: Secondary | ICD-10-CM | POA: Diagnosis not present

## 2024-04-11 DIAGNOSIS — Z23 Encounter for immunization: Secondary | ICD-10-CM

## 2024-04-11 DIAGNOSIS — E785 Hyperlipidemia, unspecified: Secondary | ICD-10-CM | POA: Diagnosis not present

## 2024-04-11 DIAGNOSIS — Z0000 Encounter for general adult medical examination without abnormal findings: Secondary | ICD-10-CM

## 2024-04-11 NOTE — Assessment & Plan Note (Signed)
 LDL is  Lab Results  Component Value Date   LDLCALC 145 (H) 10/07/2023    Current medication regimen is crestor . Goal LDL is < 100.

## 2024-04-11 NOTE — Assessment & Plan Note (Signed)
 Managed with diet and exercise. Lab Results  Component Value Date   HGBA1C 5.9 (H) 04/09/2023

## 2024-04-11 NOTE — Patient Instructions (Signed)
 Call [facility] to schedule your mammogram at [phone].

## 2024-04-11 NOTE — Progress Notes (Signed)
 Date:  04/11/2024   Name:  Renee Gomez   DOB:  Jun 09, 1970   MRN:  [id number]   Chief Complaint: Annual Exam Renee Gomez is a 52 y.o. female who presents today for her Complete Annual Exam. She feels well. She reports exercising. She reports she is sleeping well. Breast complaints - none.  Health Maintenance  Topic Date Due   HIV Screening  Never done   Hepatitis B Vaccine (1 of 3 - 19+ 3-dose series) Never done   COVID-19 Vaccine (2 - 2025-26 season) 02/07/2024   Breast Cancer Screening  08/17/2024   DTaP/Tdap/Td vaccine (2 - Td or Tdap) 08/27/2025   Pap with HPV screening  04/08/2028   Colon Cancer Screening  04/23/2029   Pneumococcal Vaccine for age over 72  Completed   Flu Shot  Completed   Hepatitis C Screening  Completed   Zoster (Shingles) Vaccine  Completed   HPV Vaccine  Aged Out   Meningitis B Vaccine  Aged Out    Hyperlipidemia This is a chronic problem. The problem is controlled. Pertinent negatives include no chest pain, myalgias or shortness of breath. Current antihyperlipidemic treatment includes statins. The current treatment provides moderate improvement of lipids.  Gastroesophageal Reflux She complains of heartburn. She reports no abdominal pain, no chest pain, no coughing, no dysphagia or no wheezing. This is a recurrent problem. The problem occurs occasionally. Pertinent negatives include no fatigue. She has tried an antacid and a histamine-2 antagonist for the symptoms.    Review of Systems  Constitutional:  Negative for fatigue and unexpected weight change.  HENT:  Negative for trouble swallowing.   Eyes:  Negative for visual disturbance.  Respiratory:  Negative for cough, chest tightness, shortness of breath and wheezing.   Cardiovascular:  Negative for chest pain, palpitations and leg swelling.  Gastrointestinal:  Positive for heartburn. Negative for abdominal pain, constipation, diarrhea and dysphagia.  Musculoskeletal:  Positive for  arthralgias (elbow tendontitis much improved after steroid injections). Negative for myalgias.  Neurological:  Negative for dizziness, weakness, light-headedness and headaches.     Lab Results  Component Value Date   NA 142 10/07/2023   K 4.2 10/07/2023   CO2 25 10/07/2023   GLUCOSE 88 10/07/2023   BUN 14 10/07/2023   CREATININE 0.63 10/07/2023   CALCIUM  9.4 10/07/2023   EGFR 105 10/07/2023   GFRNONAA 110 04/02/2020   Lab Results  Component Value Date   CHOL 221 (H) 10/07/2023   HDL 60 10/07/2023   LDLCALC 145 (H) 10/07/2023   TRIG 91 10/07/2023   CHOLHDL 3.7 10/07/2023   Lab Results  Component Value Date   TSH 2.220 04/09/2023   Lab Results  Component Value Date   HGBA1C 5.9 (H) 04/09/2023   Lab Results  Component Value Date   WBC 6.2 04/09/2023   HGB 13.6 04/09/2023   HCT 42.2 04/09/2023   MCV 95 04/09/2023   PLT 240 04/09/2023   Lab Results  Component Value Date   ALT 14 10/07/2023   AST 20 10/07/2023   ALKPHOS 68 10/07/2023   BILITOT 0.4 10/07/2023   No results found for: MARIEN BOLLS, VD25OH   Patient Active Problem List   Diagnosis Date Noted   Medial epicondylitis, left 12/14/2023   Medial epicondylitis, right 11/16/2023   Prediabetes 04/09/2023   Family history of cerebral aneurysm 04/09/2023   Vertigo, intermittent 03/15/2018   Low back pain with sciatica 01/18/2015   Fibrocystic breast 01/18/2015   Hyperlipidemia, mild  01/18/2015    No Known Allergies  Past Surgical History:  Procedure Laterality Date   APPENDECTOMY     BREAST EXCISIONAL BIOPSY Bilateral 25+yrs ago   neg   BREAST SURGERY     COLONOSCOPY WITH PROPOFOL  N/A 04/24/2019   Procedure: COLONOSCOPY WITH PROPOFOL ;  Surgeon: Unk Corinn Skiff, MD;  Location: [facility];  Service: Gastroenterology;  Laterality: N/A;   JOINT REPLACEMENT     LITHOTRIPSY  1995   MINOR BREAST BIOPSY Bilateral    neg x 2   TOTAL HIP ARTHROPLASTY Right 12/2012    Social History    Tobacco Use   Smoking status: Never    Passive exposure: Never   Smokeless tobacco: Never   Tobacco comments:    None  Vaping Use   Vaping status: Never Used  Substance Use Topics   Alcohol use: No   Drug use: No     Medication list has been reviewed and updated.  Current Meds  Medication Sig   Calcium  Carbonate-Vitamin D (CALCIUM  PLUS VITAMIN D PO) Take by mouth daily at 6 (six) AM.   rosuvastatin  (CRESTOR ) 10 MG tablet TAKE 1 TABLET(10 MG) BY MOUTH DAILY   TRYPTYR 0.003 % SOLN Apply 1 drop to eye 2 (two) times daily.   XIIDRA 5 % SOLN        11/10/2023   11:22 AM 10/07/2023   11:02 AM 04/09/2023   10:37 AM 12/30/2022    1:37 PM  GAD 7 : Generalized Anxiety Score  Nervous, Anxious, on Edge 1 1 1 1   Control/stop worrying 1 1 1 1   Worry too much - different things 1 1 1 1   Trouble relaxing 1 1 1 1   Restless 1 1 1 1   Easily annoyed or irritable 1 1 2 1   Afraid - awful might happen 1 1 2 1   Total GAD 7 Score 7 7 9 7   Anxiety Difficulty Somewhat difficult Somewhat difficult Very difficult Somewhat difficult       02/14/2024    1:14 PM 11/10/2023   11:22 AM 10/07/2023   11:02 AM  Depression screen PHQ 2/9  Decreased Interest 0 0 0  Down, Depressed, Hopeless 0 1 1  PHQ - 2 Score 0 1 1  Altered sleeping  2 2  Tired, decreased energy  2 2  Change in appetite  1 1  Feeling bad or failure about yourself   1 1  Trouble concentrating  1 1  Moving slowly or fidgety/restless  0 0  Suicidal thoughts  0 0  PHQ-9 Score  8 8  Difficult doing work/chores  Not difficult at all Somewhat difficult    BP Readings from Last 3 Encounters:  04/11/24 122/74  02/14/24 120/74  12/14/23 120/68    Physical Exam Vitals and nursing note reviewed.  Constitutional:      General: She is not in acute distress.    Appearance: Normal appearance. She is well-developed.  HENT:     Head: Normocephalic and atraumatic.     Right Ear: Tympanic membrane and ear canal normal.     Left Ear:  Tympanic membrane and ear canal normal.     Nose:     Right Sinus: No maxillary sinus tenderness.     Left Sinus: No maxillary sinus tenderness.  Eyes:     General: No scleral icterus.       Right eye: No discharge.        Left eye: No discharge.     Conjunctiva/sclera:  Conjunctivae normal.  Neck:     Thyroid: No thyromegaly.     Vascular: No carotid bruit.  Cardiovascular:     Rate and Rhythm: Normal rate and regular rhythm.     Pulses: Normal pulses.     Heart sounds: Normal heart sounds.  Pulmonary:     Effort: Pulmonary effort is normal. No respiratory distress.     Breath sounds: No wheezing.  Abdominal:     General: Bowel sounds are normal.     Palpations: Abdomen is soft.     Tenderness: There is no abdominal tenderness.  Musculoskeletal:     Cervical back: Normal range of motion. No erythema.     Right lower leg: No edema.     Left lower leg: No edema.  Lymphadenopathy:     Cervical: No cervical adenopathy.  Skin:    General: Skin is warm and dry.     Capillary Refill: Capillary refill takes less than 2 seconds.     Findings: No rash.  Neurological:     General: No focal deficit present.     Mental Status: She is alert and oriented to person, place, and time.     Cranial Nerves: No cranial nerve deficit.     Sensory: No sensory deficit.     Deep Tendon Reflexes: Reflexes are normal and symmetric.  Psychiatric:        Attention and Perception: Attention normal.        Mood and Affect: Mood normal.     Wt Readings from Last 3 Encounters:  04/11/24 124 lb (56.2 kg)  02/14/24 124 lb (56.2 kg)  12/14/23 123 lb (55.8 kg)    BP 122/74   Pulse 77   Ht 5' 1 (1.549 m)   Wt 124 lb (56.2 kg)   LMP 03/11/2020   SpO2 96%   BMI 23.43 kg/m   Assessment and Plan:  Problem List Items Addressed This Visit       Unprioritized   Hyperlipidemia, mild (Chronic)   LDL is  Lab Results  Component Value Date   LDLCALC 145 (H) 10/07/2023    Current medication  regimen is crestor . Goal LDL is < 100.       Relevant Orders   Comprehensive metabolic panel with GFR   Lipid panel   Prediabetes   Managed with diet and exercise. Lab Results  Component Value Date   HGBA1C 5.9 (H) 04/09/2023         Relevant Orders   Hemoglobin A1c   Other Visit Diagnoses       Annual physical exam    -  Primary   Pap and CRC screening up to date   Relevant Orders   CBC with Differential/Platelet   Comprehensive metabolic panel with GFR   Lipid panel   Hemoglobin A1c   TSH     Encounter for screening mammogram for breast cancer       schedule in March @ [facility]   Relevant Orders   MM 3D SCREENING MAMMOGRAM BILATERAL BREAST     Encounter for immunization       Relevant Orders   Pneumococcal conjugate vaccine 20-valent (Completed)       Return in about 6 months (around 10/09/2024) for lipids - Dr. Lemon.    Leita HILARIO Adie, MD [facility] [place]

## 2024-04-12 ENCOUNTER — Ambulatory Visit: Payer: Self-pay | Admitting: Internal Medicine

## 2024-04-12 LAB — COMPREHENSIVE METABOLIC PANEL WITH GFR
ALT: 16 IU/L (ref 0–32)
AST: 19 IU/L (ref 0–40)
Albumin: 4.4 g/dL (ref 3.8–4.9)
Alkaline Phosphatase: 60 IU/L (ref 49–135)
BUN/Creatinine Ratio: 23 (ref 9–23)
BUN: 13 mg/dL (ref 6–24)
Bilirubin Total: 0.4 mg/dL (ref 0.0–1.2)
CO2: 25 mmol/L (ref 20–29)
Calcium: 9.5 mg/dL (ref 8.7–10.2)
Chloride: 103 mmol/L (ref 96–106)
Creatinine, Ser: 0.56 mg/dL — ABNORMAL LOW (ref 0.57–1.00)
Globulin, Total: 2.5 g/dL (ref 1.5–4.5)
Glucose: 90 mg/dL (ref 70–99)
Potassium: 4.3 mmol/L (ref 3.5–5.2)
Sodium: 141 mmol/L (ref 134–144)
Total Protein: 6.9 g/dL (ref 6.0–8.5)
eGFR: 108 mL/min/1.73 (ref >59)

## 2024-04-12 LAB — CBC WITH DIFFERENTIAL/PLATELET
Basophils Absolute: 0 x10E3/uL (ref 0.0–0.2)
Basos: 0 %
EOS (ABSOLUTE): 0.1 x10E3/uL (ref 0.0–0.4)
Eos: 2 %
Hematocrit: 40 % (ref 34.0–46.6)
Hemoglobin: 12.9 g/dL (ref 11.1–15.9)
Immature Grans (Abs): 0 x10E3/uL (ref 0.0–0.1)
Immature Granulocytes: 0 %
Lymphocytes Absolute: 2.1 x10E3/uL (ref 0.7–3.1)
Lymphs: 30 %
MCH: 30.8 pg (ref 26.6–33.0)
MCHC: 32.3 g/dL (ref 31.5–35.7)
MCV: 96 fL (ref 79–97)
Monocytes Absolute: 0.5 x10E3/uL (ref 0.1–0.9)
Monocytes: 7 %
Neutrophils Absolute: 4.2 x10E3/uL (ref 1.4–7.0)
Neutrophils: 61 %
Platelets: 250 x10E3/uL (ref 150–450)
RBC: 4.19 x10E6/uL (ref 3.77–5.28)
RDW: 13.1 % (ref 11.7–15.4)
WBC: 6.9 x10E3/uL (ref 3.4–10.8)

## 2024-04-12 LAB — HEMOGLOBIN A1C
Est. average glucose Bld gHb Est-mCnc: 131 mg/dL
Hgb A1c MFr Bld: 6.2 % — ABNORMAL HIGH (ref 4.8–5.6)

## 2024-04-12 LAB — LIPID PANEL
Chol/HDL Ratio: 2.9 ratio (ref 0.0–4.4)
Cholesterol, Total: 183 mg/dL (ref 100–199)
HDL: 63 mg/dL (ref >39)
LDL Chol Calc (NIH): 101 mg/dL — ABNORMAL HIGH (ref 0–99)
Triglycerides: 109 mg/dL (ref 0–149)
VLDL Cholesterol Cal: 19 mg/dL (ref 5–40)

## 2024-04-12 LAB — TSH: TSH: 3.49 u[IU]/mL (ref 0.450–4.500)

## 2024-10-11 ENCOUNTER — Ambulatory Visit: Admitting: Student
